# Patient Record
Sex: Male | Born: 1965 | Race: Black or African American | Hispanic: No | Marital: Single | Smoking: Never smoker
Health system: Southern US, Community
[De-identification: ages and names within clinical notes are randomized; demographics above are authoritative.]

## PROBLEM LIST (undated history)

## (undated) DIAGNOSIS — E785 Hyperlipidemia, unspecified: Secondary | ICD-10-CM

## (undated) DIAGNOSIS — E119 Type 2 diabetes mellitus without complications: Secondary | ICD-10-CM

## (undated) DIAGNOSIS — I10 Essential (primary) hypertension: Secondary | ICD-10-CM

## (undated) HISTORY — DX: Type 2 diabetes mellitus without complications: E11.9

## (undated) HISTORY — PX: BARIATRIC SURGERY: SHX1103

## (undated) HISTORY — DX: Essential (primary) hypertension: I10

## (undated) HISTORY — DX: Hyperlipidemia, unspecified: E78.5

---

## 1998-06-09 ENCOUNTER — Emergency Department (HOSPITAL_COMMUNITY): Admission: EM | Admit: 1998-06-09 | Discharge: 1998-06-09 | Payer: Self-pay | Admitting: Emergency Medicine

## 1999-01-21 ENCOUNTER — Emergency Department (HOSPITAL_COMMUNITY): Admission: EM | Admit: 1999-01-21 | Discharge: 1999-01-21 | Payer: Self-pay | Admitting: Emergency Medicine

## 1999-01-25 ENCOUNTER — Emergency Department (HOSPITAL_COMMUNITY): Admission: EM | Admit: 1999-01-25 | Discharge: 1999-01-25 | Payer: Self-pay | Admitting: Emergency Medicine

## 2003-09-11 ENCOUNTER — Emergency Department (HOSPITAL_COMMUNITY): Admission: AD | Admit: 2003-09-11 | Discharge: 2003-09-11 | Payer: Self-pay | Admitting: Family Medicine

## 2003-10-16 ENCOUNTER — Emergency Department (HOSPITAL_COMMUNITY): Admission: EM | Admit: 2003-10-16 | Discharge: 2003-10-17 | Payer: Self-pay | Admitting: Emergency Medicine

## 2006-09-22 ENCOUNTER — Emergency Department (HOSPITAL_COMMUNITY): Admission: EM | Admit: 2006-09-22 | Discharge: 2006-09-22 | Payer: Self-pay | Admitting: Emergency Medicine

## 2016-07-14 DIAGNOSIS — E1142 Type 2 diabetes mellitus with diabetic polyneuropathy: Secondary | ICD-10-CM | POA: Insufficient documentation

## 2016-11-22 DIAGNOSIS — F419 Anxiety disorder, unspecified: Secondary | ICD-10-CM | POA: Insufficient documentation

## 2017-02-18 DIAGNOSIS — B351 Tinea unguium: Secondary | ICD-10-CM | POA: Insufficient documentation

## 2017-02-18 DIAGNOSIS — B353 Tinea pedis: Secondary | ICD-10-CM | POA: Insufficient documentation

## 2017-06-03 DIAGNOSIS — G4733 Obstructive sleep apnea (adult) (pediatric): Secondary | ICD-10-CM | POA: Insufficient documentation

## 2018-04-08 DIAGNOSIS — Z9884 Bariatric surgery status: Secondary | ICD-10-CM | POA: Insufficient documentation

## 2018-05-23 DIAGNOSIS — E1169 Type 2 diabetes mellitus with other specified complication: Secondary | ICD-10-CM | POA: Insufficient documentation

## 2019-05-01 DIAGNOSIS — Z9884 Bariatric surgery status: Secondary | ICD-10-CM | POA: Insufficient documentation

## 2020-12-28 ENCOUNTER — Encounter: Payer: Self-pay | Admitting: Critical Care Medicine

## 2020-12-29 ENCOUNTER — Other Ambulatory Visit: Payer: Self-pay | Admitting: Critical Care Medicine

## 2020-12-29 ENCOUNTER — Telehealth: Payer: Self-pay | Admitting: Critical Care Medicine

## 2020-12-29 ENCOUNTER — Other Ambulatory Visit: Payer: Self-pay | Admitting: *Deleted

## 2020-12-29 MED ORDER — TOPIRAMATE 100 MG PO TABS: 100.0000 mg | ORAL_TABLET | Freq: Two times a day (BID) | ORAL | 2 refills | Status: DC

## 2020-12-29 MED ORDER — PREGABALIN 150 MG PO CAPS
150.0000 mg | ORAL_CAPSULE | Freq: Two times a day (BID) | ORAL | 2 refills | Status: DC
Start: 2020-12-29 — End: 2021-01-16

## 2020-12-29 MED ORDER — LISINOPRIL 30 MG PO TABS: 30.0000 mg | ORAL_TABLET | Freq: Every day | ORAL | 2 refills | Status: DC

## 2020-12-29 MED ORDER — TRIAMCINOLONE ACETONIDE 0.1 % EX CREA: 1.0000 "application " | TOPICAL_CREAM | Freq: Two times a day (BID) | CUTANEOUS | 0 refills | Status: DC

## 2020-12-29 MED ORDER — PREDNISONE 10 MG PO TABS: ORAL_TABLET | ORAL | 0 refills | Status: DC

## 2020-12-29 MED ORDER — AZITHROMYCIN 250 MG PO TABS: ORAL_TABLET | ORAL | 0 refills | Status: DC

## 2020-12-29 MED ORDER — NYSTATIN 100000 UNIT/GM EX POWD: 1.0000 "application " | Freq: Three times a day (TID) | CUTANEOUS | 2 refills | Status: AC

## 2020-12-29 MED ORDER — POLYETHYLENE GLYCOL 3350 17 G PO PACK: 17.0000 g | PACK | Freq: Every day | ORAL | 2 refills | Status: DC

## 2020-12-29 MED ORDER — PROAIR RESPICLICK 108 (90 BASE) MCG/ACT IN AEPB: 2.0000 | INHALATION_SPRAY | Freq: Four times a day (QID) | RESPIRATORY_TRACT | 4 refills | Status: DC | PRN

## 2020-12-29 NOTE — Telephone Encounter (Signed)
Pt called stating that he was seen yesterday 12/28/20 at the [facility]. He states that he was also missing the medications lisinopril and lyrica refilled. He states that he is needing to have nurse pick these up for him to take to him. Please advise.

## 2020-12-29 NOTE — Telephone Encounter (Signed)
Medications has been refilled
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
## 2021-01-04 ENCOUNTER — Encounter: Payer: Self-pay | Admitting: Critical Care Medicine

## 2021-01-04 ENCOUNTER — Other Ambulatory Visit: Payer: Self-pay | Admitting: Critical Care Medicine

## 2021-01-04 MED ORDER — FUROSEMIDE 40 MG PO TABS: 40.0000 mg | ORAL_TABLET | Freq: Every day | ORAL | 3 refills | Status: DC | PRN
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
## 2021-01-12 ENCOUNTER — Telehealth: Payer: Self-pay | Admitting: Critical Care Medicine

## 2021-01-12 NOTE — Telephone Encounter (Signed)
Will have shelter nurse f/u

## 2021-01-12 NOTE — Telephone Encounter (Signed)
Copied from CRM [id number]. Topic: General - Other >> Jan 11, 2021  6:48 PM Pawlus, Jeremy Bauer wrote: Reason for CRM: Pt stated he is at the [facility] and requested Bauer call from [name] as soon as possible. Please advise.
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
## 2021-01-16 ENCOUNTER — Telehealth: Payer: Self-pay

## 2021-01-16 ENCOUNTER — Other Ambulatory Visit: Payer: Self-pay

## 2021-01-16 ENCOUNTER — Ambulatory Visit: Payer: Medicaid Other | Attending: Critical Care Medicine | Admitting: Critical Care Medicine

## 2021-01-16 ENCOUNTER — Encounter: Payer: Self-pay | Admitting: Critical Care Medicine

## 2021-01-16 VITALS — BP 120/78 | HR 79 | Ht 73.0 in | Wt >= 6400 oz

## 2021-01-16 DIAGNOSIS — Z7984 Long term (current) use of oral hypoglycemic drugs: Secondary | ICD-10-CM | POA: Insufficient documentation

## 2021-01-16 DIAGNOSIS — E1142 Type 2 diabetes mellitus with diabetic polyneuropathy: Secondary | ICD-10-CM | POA: Insufficient documentation

## 2021-01-16 DIAGNOSIS — R21 Rash and other nonspecific skin eruption: Secondary | ICD-10-CM | POA: Insufficient documentation

## 2021-01-16 DIAGNOSIS — N400 Enlarged prostate without lower urinary tract symptoms: Secondary | ICD-10-CM | POA: Diagnosis not present

## 2021-01-16 DIAGNOSIS — S40861A Insect bite (nonvenomous) of right upper arm, initial encounter: Secondary | ICD-10-CM | POA: Diagnosis not present

## 2021-01-16 DIAGNOSIS — E1165 Type 2 diabetes mellitus with hyperglycemia: Secondary | ICD-10-CM

## 2021-01-16 DIAGNOSIS — E871 Hypo-osmolality and hyponatremia: Secondary | ICD-10-CM | POA: Insufficient documentation

## 2021-01-16 DIAGNOSIS — R269 Unspecified abnormalities of gait and mobility: Secondary | ICD-10-CM | POA: Insufficient documentation

## 2021-01-16 DIAGNOSIS — Z1159 Encounter for screening for other viral diseases: Secondary | ICD-10-CM

## 2021-01-16 DIAGNOSIS — Z79899 Other long term (current) drug therapy: Secondary | ICD-10-CM | POA: Insufficient documentation

## 2021-01-16 DIAGNOSIS — Z6841 Body Mass Index (BMI) 40.0 and over, adult: Secondary | ICD-10-CM | POA: Insufficient documentation

## 2021-01-16 DIAGNOSIS — Z87891 Personal history of nicotine dependence: Secondary | ICD-10-CM | POA: Insufficient documentation

## 2021-01-16 DIAGNOSIS — N529 Male erectile dysfunction, unspecified: Secondary | ICD-10-CM | POA: Diagnosis not present

## 2021-01-16 DIAGNOSIS — Z5901 Sheltered homelessness: Secondary | ICD-10-CM | POA: Diagnosis not present

## 2021-01-16 DIAGNOSIS — W57XXXA Bitten or stung by nonvenomous insect and other nonvenomous arthropods, initial encounter: Secondary | ICD-10-CM | POA: Diagnosis not present

## 2021-01-16 DIAGNOSIS — E785 Hyperlipidemia, unspecified: Secondary | ICD-10-CM | POA: Insufficient documentation

## 2021-01-16 DIAGNOSIS — Z114 Encounter for screening for human immunodeficiency virus [HIV]: Secondary | ICD-10-CM | POA: Diagnosis not present

## 2021-01-16 DIAGNOSIS — E1169 Type 2 diabetes mellitus with other specified complication: Secondary | ICD-10-CM | POA: Diagnosis not present

## 2021-01-16 DIAGNOSIS — E119 Type 2 diabetes mellitus without complications: Secondary | ICD-10-CM | POA: Insufficient documentation

## 2021-01-16 DIAGNOSIS — I10 Essential (primary) hypertension: Secondary | ICD-10-CM | POA: Diagnosis not present

## 2021-01-16 DIAGNOSIS — S40862A Insect bite (nonvenomous) of left upper arm, initial encounter: Secondary | ICD-10-CM | POA: Insufficient documentation

## 2021-01-16 DIAGNOSIS — N521 Erectile dysfunction due to diseases classified elsewhere: Secondary | ICD-10-CM

## 2021-01-16 DIAGNOSIS — Z9884 Bariatric surgery status: Secondary | ICD-10-CM | POA: Diagnosis not present

## 2021-01-16 DIAGNOSIS — N401 Enlarged prostate with lower urinary tract symptoms: Secondary | ICD-10-CM

## 2021-01-16 DIAGNOSIS — R3911 Hesitancy of micturition: Secondary | ICD-10-CM

## 2021-01-16 MED ORDER — TAMSULOSIN HCL 0.4 MG PO CAPS
0.4000 mg | ORAL_CAPSULE | Freq: Every day | ORAL | 3 refills | Status: DC
  Filled 2021-01-16: qty 30, 30d supply, fill #0
  Filled 2021-02-12: qty 30, 30d supply, fill #1
  Filled 2021-03-15 (×3): qty 30, 30d supply, fill #2

## 2021-01-16 MED ORDER — TRULICITY 0.75 MG/0.5ML ~~LOC~~ SOAJ
0.7500 mg | SUBCUTANEOUS | 4 refills | Status: DC
  Filled 2021-01-16: qty 2, 28d supply, fill #0

## 2021-01-16 MED ORDER — ACCU-CHEK GUIDE VI STRP
ORAL_STRIP | 12 refills | Status: DC
  Filled 2021-01-16: qty 100, 50d supply, fill #0

## 2021-01-16 MED ORDER — TAMSULOSIN HCL 0.4 MG PO CAPS: 0.4000 mg | ORAL_CAPSULE | Freq: Every day | ORAL | 3 refills | Status: DC

## 2021-01-16 MED ORDER — TRIAMCINOLONE ACETONIDE 0.1 % EX CREA: 1.0000 "application " | TOPICAL_CREAM | Freq: Two times a day (BID) | CUTANEOUS | 0 refills | Status: DC

## 2021-01-16 MED ORDER — PREGABALIN 225 MG PO CAPS
225.0000 mg | ORAL_CAPSULE | Freq: Two times a day (BID) | ORAL | 1 refills | Status: DC
  Filled 2021-01-16 (×2): qty 60, 30d supply, fill #0
  Filled 2021-02-28: qty 60, 30d supply, fill #1

## 2021-01-16 MED ORDER — ACCU-CHEK SOFTCLIX LANCETS MISC
12 refills | Status: DC
  Filled 2021-01-16: qty 100, 50d supply, fill #0

## 2021-01-16 MED ORDER — TRIAMCINOLONE ACETONIDE 0.1 % EX CREA
1.0000 "application " | TOPICAL_CREAM | Freq: Two times a day (BID) | CUTANEOUS | 0 refills | Status: DC
  Filled 2021-01-16: qty 30, 15d supply, fill #0

## 2021-01-16 MED ORDER — ACCU-CHEK GUIDE W/DEVICE KIT
PACK | 0 refills | Status: AC
  Filled 2021-01-16: qty 1, 30d supply, fill #0

## 2021-01-16 MED ORDER — MELOXICAM 15 MG PO TABS
15.0000 mg | ORAL_TABLET | Freq: Every day | ORAL | 0 refills | Status: DC
  Filled 2021-01-16: qty 30, 30d supply, fill #0

## 2021-01-16 NOTE — Assessment & Plan Note (Signed)
Plan referral to bariatric weight loss clinic

## 2021-01-16 NOTE — Progress Notes (Signed)
Pain in legs. Urination problem.

## 2021-01-16 NOTE — Assessment & Plan Note (Signed)
Benign prostatic hypertrophy we will plan to begin Flomax

## 2021-01-16 NOTE — Assessment & Plan Note (Signed)
Type 2 diabetes not well controlled on 1000 mg twice daily metformin  Begin Trulicity 0.75 mg weekly  Diabetic diet education given

## 2021-01-16 NOTE — Assessment & Plan Note (Addendum)
Severe diabetic neuropathy plan to increase Lyrica 225 mg twice daily

## 2021-01-16 NOTE — Patient Instructions (Addendum)
Labs today include metabolic panel hepatitis C and HIV  A new glucose meter will be issued with testing supplies  We will ask Medicaid to supply you with a Rollator someone will contact you to deliver this to the shelter  Will increase Lyrica to 225 mg twice daily  Begin Trulicity 1 injection weekly   No change in other medications other than steroid cream given for the bedbug rash  Also will begin Flomax daily to improve the stream of your urine  Meloxicam daily given for joint pain  Return to see Dr. Delford Field here in the clinic in 2 months and we will continue to check on you weekly at the shelter

## 2021-01-16 NOTE — Assessment & Plan Note (Signed)
Patient requesting medication for erectile dysfunction we will hold for now

## 2021-01-16 NOTE — Telephone Encounter (Signed)
Order for rollator faxed to [facility].  This CM informed patient that [facility] should be contacting him about delivery of the rollator.  If they are not able to deliver the DME, instructed him to contact this CM to arrange for pick up Jeremy Bauer

## 2021-01-17 ENCOUNTER — Other Ambulatory Visit: Payer: Self-pay

## 2021-01-17 LAB — COMPREHENSIVE METABOLIC PANEL
ALT: 78 IU/L — ABNORMAL HIGH (ref 0–44)
AST: 33 IU/L (ref 0–40)
Albumin/Globulin Ratio: 1.1 — ABNORMAL LOW (ref 1.2–2.2)
Albumin: 3.9 g/dL (ref 3.8–4.9)
Alkaline Phosphatase: 79 IU/L (ref 44–121)
BUN/Creatinine Ratio: 19 (ref 9–20)
BUN: 20 mg/dL (ref 6–24)
Bilirubin Total: 0.3 mg/dL (ref 0.0–1.2)
CO2: 22 mmol/L (ref 20–29)
Calcium: 9.6 mg/dL (ref 8.7–10.2)
Chloride: 101 mmol/L (ref 96–106)
Creatinine, Ser: 1.03 mg/dL (ref 0.76–1.27)
Globulin, Total: 3.5 g/dL (ref 1.5–4.5)
Glucose: 318 mg/dL — ABNORMAL HIGH (ref 65–99)
Potassium: 5 mmol/L (ref 3.5–5.2)
Sodium: 135 mmol/L (ref 134–144)
Total Protein: 7.4 g/dL (ref 6.0–8.5)
eGFR: 86 mL/min/{1.73_m2} (ref >59)

## 2021-01-17 LAB — HEMOGLOBIN A1C
Est. average glucose Bld gHb Est-mCnc: 206 mg/dL
Hgb A1c MFr Bld: 8.8 % — ABNORMAL HIGH (ref 4.8–5.6)

## 2021-01-17 LAB — HCV INTERPRETATION

## 2021-01-17 LAB — HCV AB W REFLEX TO QUANT PCR: HCV Ab: <0.1 s/co ratio (ref 0.0–0.9)

## 2021-01-17 LAB — HIV ANTIBODY (ROUTINE TESTING W REFLEX): HIV Screen 4th Generation wRfx: NONREACTIVE

## 2021-01-18 ENCOUNTER — Other Ambulatory Visit: Payer: Self-pay

## 2021-01-18 ENCOUNTER — Encounter: Payer: Self-pay | Admitting: Critical Care Medicine

## 2021-01-18 ENCOUNTER — Encounter: Payer: Self-pay | Admitting: *Deleted

## 2021-01-18 ENCOUNTER — Other Ambulatory Visit (HOSPITAL_COMMUNITY): Payer: Self-pay

## 2021-01-18 ENCOUNTER — Other Ambulatory Visit: Payer: Self-pay | Admitting: Critical Care Medicine

## 2021-01-18 DIAGNOSIS — L84 Corns and callosities: Secondary | ICD-10-CM

## 2021-01-18 DIAGNOSIS — E1165 Type 2 diabetes mellitus with hyperglycemia: Secondary | ICD-10-CM

## 2021-01-18 MED ORDER — TADALAFIL 10 MG PO TABS
10.0000 mg | ORAL_TABLET | ORAL | 1 refills | Status: DC | PRN
  Filled 2021-01-18: qty 10, 20d supply, fill #0
  Filled 2021-02-28: qty 10, 20d supply, fill #1

## 2021-01-18 NOTE — Congregational Nurse Program (Signed)
Discussed and demo cbg, instructed on trulicity inj.  Discussed h2o intake

## 2021-01-18 NOTE — Progress Notes (Signed)
Patient ID: Jeremy Bauer, male   DOB: 08/06/1965, 55 y.o.   MRN: [id number] Is a 55 year old male seen at the [facility] in follow-up.  We saw him earlier this week to establish at health and wellness.  I went over with the patient all of his medications.  On exam blood pressure 132/82 pulse is 85 saturation 99% room air blood sugar was 350 note he just started his first dose of Trulicity today also maintaining metformin trying to improve his diet  Patient would like an ophthalmology referral and also podiatry referral we will comply with this we will also give him a short course of Cialis to take as needed for erectile dysfunction nothing else was needed at this time

## 2021-01-20 ENCOUNTER — Inpatient Hospital Stay (HOSPITAL_COMMUNITY)
Admission: EM | Admit: 2021-01-20 | Discharge: 2021-01-22 | DRG: 683 | Disposition: A | Discharge status: Home / Self Care | Payer: Medicaid Other | Attending: Internal Medicine | Admitting: Internal Medicine

## 2021-01-20 ENCOUNTER — Emergency Department (HOSPITAL_COMMUNITY): Payer: Medicaid Other

## 2021-01-20 ENCOUNTER — Other Ambulatory Visit: Payer: Self-pay

## 2021-01-20 ENCOUNTER — Encounter (HOSPITAL_COMMUNITY): Payer: Self-pay | Admitting: Emergency Medicine

## 2021-01-20 DIAGNOSIS — Z79899 Other long term (current) drug therapy: Secondary | ICD-10-CM

## 2021-01-20 DIAGNOSIS — E1165 Type 2 diabetes mellitus with hyperglycemia: Secondary | ICD-10-CM | POA: Diagnosis present

## 2021-01-20 DIAGNOSIS — N179 Acute kidney failure, unspecified: Principal | ICD-10-CM | POA: Diagnosis present

## 2021-01-20 DIAGNOSIS — R0789 Other chest pain: Secondary | ICD-10-CM | POA: Diagnosis present

## 2021-01-20 DIAGNOSIS — F1010 Alcohol abuse, uncomplicated: Secondary | ICD-10-CM | POA: Diagnosis present

## 2021-01-20 DIAGNOSIS — Z8673 Personal history of transient ischemic attack (TIA), and cerebral infarction without residual deficits: Secondary | ICD-10-CM

## 2021-01-20 DIAGNOSIS — E1142 Type 2 diabetes mellitus with diabetic polyneuropathy: Secondary | ICD-10-CM | POA: Diagnosis present

## 2021-01-20 DIAGNOSIS — I951 Orthostatic hypotension: Secondary | ICD-10-CM | POA: Diagnosis present

## 2021-01-20 DIAGNOSIS — Z20822 Contact with and (suspected) exposure to covid-19: Secondary | ICD-10-CM | POA: Diagnosis present

## 2021-01-20 DIAGNOSIS — N400 Enlarged prostate without lower urinary tract symptoms: Secondary | ICD-10-CM | POA: Diagnosis present

## 2021-01-20 DIAGNOSIS — K8020 Calculus of gallbladder without cholecystitis without obstruction: Secondary | ICD-10-CM | POA: Diagnosis present

## 2021-01-20 DIAGNOSIS — Z9884 Bariatric surgery status: Secondary | ICD-10-CM

## 2021-01-20 DIAGNOSIS — E860 Dehydration: Secondary | ICD-10-CM | POA: Diagnosis present

## 2021-01-20 DIAGNOSIS — Z7984 Long term (current) use of oral hypoglycemic drugs: Secondary | ICD-10-CM

## 2021-01-20 DIAGNOSIS — G4733 Obstructive sleep apnea (adult) (pediatric): Secondary | ICD-10-CM | POA: Diagnosis present

## 2021-01-20 DIAGNOSIS — F1410 Cocaine abuse, uncomplicated: Secondary | ICD-10-CM | POA: Diagnosis present

## 2021-01-20 DIAGNOSIS — E785 Hyperlipidemia, unspecified: Secondary | ICD-10-CM | POA: Diagnosis present

## 2021-01-20 DIAGNOSIS — I959 Hypotension, unspecified: Secondary | ICD-10-CM

## 2021-01-20 DIAGNOSIS — L03115 Cellulitis of right lower limb: Secondary | ICD-10-CM | POA: Diagnosis present

## 2021-01-20 DIAGNOSIS — Z8249 Family history of ischemic heart disease and other diseases of the circulatory system: Secondary | ICD-10-CM

## 2021-01-20 DIAGNOSIS — E861 Hypovolemia: Secondary | ICD-10-CM | POA: Diagnosis present

## 2021-01-20 DIAGNOSIS — Z833 Family history of diabetes mellitus: Secondary | ICD-10-CM

## 2021-01-20 DIAGNOSIS — N529 Male erectile dysfunction, unspecified: Secondary | ICD-10-CM | POA: Diagnosis present

## 2021-01-20 DIAGNOSIS — R55 Syncope and collapse: Secondary | ICD-10-CM | POA: Diagnosis not present

## 2021-01-20 LAB — COMPREHENSIVE METABOLIC PANEL
ALT: 70 U/L — ABNORMAL HIGH (ref 0–44)
AST: 44 U/L — ABNORMAL HIGH (ref 15–41)
Albumin: 2.9 g/dL — ABNORMAL LOW (ref 3.5–5.0)
Alkaline Phosphatase: 49 U/L (ref 38–126)
Anion gap: 4 — ABNORMAL LOW (ref 5–15)
BUN: 35 mg/dL — ABNORMAL HIGH (ref 6–20)
CO2: 22 mmol/L (ref 22–32)
Calcium: 8.7 mg/dL — ABNORMAL LOW (ref 8.9–10.3)
Chloride: 107 mmol/L (ref 98–111)
Creatinine, Ser: 1.84 mg/dL — ABNORMAL HIGH (ref 0.61–1.24)
GFR, Estimated: 43 mL/min — ABNORMAL LOW (ref >60)
Glucose, Bld: 264 mg/dL — ABNORMAL HIGH (ref 70–99)
Potassium: 4.8 mmol/L (ref 3.5–5.1)
Sodium: 133 mmol/L — ABNORMAL LOW (ref 135–145)
Total Bilirubin: 0.7 mg/dL (ref 0.3–1.2)
Total Protein: 6.1 g/dL — ABNORMAL LOW (ref 6.5–8.1)

## 2021-01-20 LAB — RESP PANEL BY RT-PCR (FLU A&B, COVID) ARPGX2
Influenza A by PCR: NEGATIVE
Influenza B by PCR: NEGATIVE
SARS Coronavirus 2 by RT PCR: NEGATIVE

## 2021-01-20 LAB — LACTIC ACID, PLASMA
Lactic Acid, Venous: 2.7 mmol/L (ref 0.5–1.9)
Lactic Acid, Venous: 2.1 mmol/L (ref 0.5–1.9)

## 2021-01-20 LAB — TROPONIN I (HIGH SENSITIVITY): Troponin I (High Sensitivity): 5 ng/L (ref <18)

## 2021-01-20 LAB — CBC
HCT: 37.8 % — ABNORMAL LOW (ref 39.0–52.0)
Hemoglobin: 12.6 g/dL — ABNORMAL LOW (ref 13.0–17.0)
MCH: 34.1 pg — ABNORMAL HIGH (ref 26.0–34.0)
MCHC: 33.3 g/dL (ref 30.0–36.0)
MCV: 102.4 fL — ABNORMAL HIGH (ref 80.0–100.0)
Platelets: 255 10*3/uL (ref 150–400)
RBC: 3.69 MIL/uL — ABNORMAL LOW (ref 4.22–5.81)
RDW: 11.4 % — ABNORMAL LOW (ref 11.5–15.5)
WBC: 6.9 10*3/uL (ref 4.0–10.5)
nRBC: 0 % (ref 0.0–0.2)

## 2021-01-20 LAB — URINALYSIS, ROUTINE W REFLEX MICROSCOPIC
Bilirubin Urine: NEGATIVE
Glucose, UA: NEGATIVE mg/dL
Hgb urine dipstick: NEGATIVE
Ketones, ur: NEGATIVE mg/dL
Leukocytes,Ua: NEGATIVE
Nitrite: NEGATIVE
Protein, ur: NEGATIVE mg/dL
Specific Gravity, Urine: 1.019 (ref 1.005–1.030)
pH: 5 (ref 5.0–8.0)

## 2021-01-20 LAB — CBG MONITORING, ED
Glucose-Capillary: 128 mg/dL — ABNORMAL HIGH (ref 70–99)
Glucose-Capillary: 296 mg/dL — ABNORMAL HIGH (ref 70–99)
Glucose-Capillary: 334 mg/dL — ABNORMAL HIGH (ref 70–99)

## 2021-01-20 LAB — GLUCOSE, CAPILLARY: Glucose-Capillary: 277 mg/dL — ABNORMAL HIGH (ref 70–99)

## 2021-01-20 MED ORDER — ALBUTEROL SULFATE 108 (90 BASE) MCG/ACT IN AEPB: 2.0000 | INHALATION_SPRAY | Freq: Four times a day (QID) | RESPIRATORY_TRACT | Status: DC | PRN

## 2021-01-20 MED ORDER — TAMSULOSIN HCL 0.4 MG PO CAPS: 0.4000 mg | ORAL_CAPSULE | Freq: Every day | ORAL | Status: DC

## 2021-01-20 MED ORDER — ADULT MULTIVITAMIN W/MINERALS CH
1.0000 | ORAL_TABLET | Freq: Every day | ORAL | Status: DC
  Administered 2021-01-21 – 2021-01-22 (×2): 1 via ORAL
  Filled 2021-01-20 (×2): qty 1

## 2021-01-20 MED ORDER — POLYETHYLENE GLYCOL 3350 17 G PO PACK
17.0000 g | PACK | Freq: Every day | ORAL | Status: DC | PRN
  Administered 2021-01-20: 17 g via ORAL
  Filled 2021-01-20: qty 1

## 2021-01-20 MED ORDER — ACETAMINOPHEN 325 MG PO TABS: 650.0000 mg | ORAL_TABLET | Freq: Four times a day (QID) | ORAL | Status: DC | PRN

## 2021-01-20 MED ORDER — TRIAMCINOLONE ACETONIDE 0.1 % EX CREA
1.0000 "application " | TOPICAL_CREAM | Freq: Two times a day (BID) | CUTANEOUS | Status: DC
  Administered 2021-01-20 – 2021-01-22 (×4): 1 via TOPICAL
  Filled 2021-01-20: qty 15

## 2021-01-20 MED ORDER — INSULIN ASPART 100 UNIT/ML IJ SOLN
0.0000 [IU] | Freq: Three times a day (TID) | INTRAMUSCULAR | Status: DC
  Administered 2021-01-21 (×2): 4 [IU] via SUBCUTANEOUS
  Administered 2021-01-21: 11 [IU] via SUBCUTANEOUS
  Administered 2021-01-22: 4 [IU] via SUBCUTANEOUS

## 2021-01-20 MED ORDER — NYSTATIN 100000 UNIT/GM EX POWD
1.0000 "application " | Freq: Three times a day (TID) | CUTANEOUS | Status: DC
  Administered 2021-01-20 – 2021-01-22 (×5): 1 via TOPICAL
  Filled 2021-01-20: qty 15

## 2021-01-20 MED ORDER — CEPHALEXIN 500 MG PO CAPS
500.0000 mg | ORAL_CAPSULE | Freq: Three times a day (TID) | ORAL | Status: DC
  Administered 2021-01-20 – 2021-01-22 (×5): 500 mg via ORAL
  Filled 2021-01-20 (×5): qty 1

## 2021-01-20 MED ORDER — POLYETHYLENE GLYCOL 3350 17 G PO PACK
17.0000 g | PACK | Freq: Every day | ORAL | Status: DC
  Administered 2021-01-21 – 2021-01-22 (×2): 17 g via ORAL
  Filled 2021-01-20 (×2): qty 1

## 2021-01-20 MED ORDER — SIMVASTATIN 20 MG PO TABS
20.0000 mg | ORAL_TABLET | Freq: Every day | ORAL | Status: DC
  Administered 2021-01-21 – 2021-01-22 (×2): 20 mg via ORAL
  Filled 2021-01-20 (×2): qty 1

## 2021-01-20 MED ORDER — SODIUM CHLORIDE 0.9 % IV SOLN: INTRAVENOUS | Status: AC

## 2021-01-20 MED ORDER — MELATONIN 3 MG PO TABS: 3.0000 mg | ORAL_TABLET | Freq: Every evening | ORAL | Status: DC | PRN

## 2021-01-20 MED ORDER — MULTIVITAMIN ADULT PO TABS: ORAL_TABLET | Freq: Every day | ORAL | Status: DC

## 2021-01-20 MED ORDER — SODIUM CHLORIDE 0.9 % IV BOLUS
1000.0000 mL | Freq: Once | INTRAVENOUS | Status: AC
  Administered 2021-01-20: 1000 mL via INTRAVENOUS

## 2021-01-20 MED ORDER — URSODIOL 250 MG PO TABS: 250.0000 mg | ORAL_TABLET | Freq: Two times a day (BID) | ORAL | Status: DC

## 2021-01-20 MED ORDER — INSULIN ASPART 100 UNIT/ML IJ SOLN
0.0000 [IU] | Freq: Every day | INTRAMUSCULAR | Status: DC
  Administered 2021-01-20: 4 [IU] via SUBCUTANEOUS

## 2021-01-20 MED ORDER — CALCIUM-VITAMIN D 600-400 MG-UNIT PO TABS: 1.0000 | ORAL_TABLET | Freq: Two times a day (BID) | ORAL | Status: DC

## 2021-01-20 MED ORDER — PREGABALIN 75 MG PO CAPS
225.0000 mg | ORAL_CAPSULE | Freq: Two times a day (BID) | ORAL | Status: DC
Start: 2021-01-20 — End: 2021-01-23
  Administered 2021-01-20 – 2021-01-22 (×4): 225 mg via ORAL
  Filled 2021-01-20 (×4): qty 3

## 2021-01-20 MED ORDER — ALBUTEROL SULFATE HFA 108 (90 BASE) MCG/ACT IN AERS
2.0000 | INHALATION_SPRAY | Freq: Four times a day (QID) | RESPIRATORY_TRACT | Status: DC | PRN
  Filled 2021-01-20: qty 6.7

## 2021-01-20 MED ORDER — CALCIUM CARBONATE-VITAMIN D 500-200 MG-UNIT PO TABS
1.0000 | ORAL_TABLET | Freq: Two times a day (BID) | ORAL | Status: DC
  Administered 2021-01-20 – 2021-01-22 (×4): 1 via ORAL
  Filled 2021-01-20 (×4): qty 1

## 2021-01-20 MED ORDER — ENOXAPARIN SODIUM 40 MG/0.4ML IJ SOSY
40.0000 mg | PREFILLED_SYRINGE | INTRAMUSCULAR | Status: DC
  Administered 2021-01-20 (×2): 40 mg via SUBCUTANEOUS
  Filled 2021-01-20: qty 0.4

## 2021-01-20 MED ORDER — TOPIRAMATE 25 MG PO TABS
100.0000 mg | ORAL_TABLET | Freq: Two times a day (BID) | ORAL | Status: DC
  Administered 2021-01-20 – 2021-01-22 (×4): 100 mg via ORAL
  Filled 2021-01-20 (×4): qty 4

## 2021-01-20 MED ORDER — INSULIN GLARGINE 100 UNIT/ML ~~LOC~~ SOLN
7.0000 [IU] | Freq: Every day | SUBCUTANEOUS | Status: DC
  Administered 2021-01-20 – 2021-01-21 (×2): 7 [IU] via SUBCUTANEOUS
  Filled 2021-01-20 (×3): qty 0.07

## 2021-01-20 MED ORDER — URSODIOL 60 MG/ML SUSP
250.0000 mg | Freq: Two times a day (BID) | ORAL | Status: DC
  Filled 2021-01-20: qty 4.2

## 2021-01-20 MED ORDER — TRAZODONE HCL 150 MG PO TABS
150.0000 mg | ORAL_TABLET | Freq: Every day | ORAL | Status: DC
  Administered 2021-01-20 – 2021-01-21 (×2): 150 mg via ORAL
  Filled 2021-01-20 (×2): qty 1

## 2021-01-20 NOTE — ED Triage Notes (Signed)
Pt BIB [facility] for blurred vision/hypotension. Manual BP with EMS was 60/40, automated was 80/50. Given 500 mL NS. Pt takes lasix, metformin. Pt endorses small amounts of diarrhea. Evaluated last week for heat exhaustion. Able to ambulate without difficulty.

## 2021-01-20 NOTE — ED Provider Notes (Signed)
[facility] EMERGENCY DEPARTMENT Provider Note   CSN: [id number] Arrival date & time: 01/20/21  1317     History Chief Complaint  Patient presents with   Hypotension    Jeremy Bauer is a 55 y.o. male.  HPI  Patient presents with lightheadedness, dizziness, blurry vision.  Started today when he was sitting down during lunch.  Has not tried any alleviating factors.  Patient states he is eating and drinking normally.  No changes in medication recently.  States that a week ago he was having chest pain that radiated to the left arm.  This is happening again today.  States he was worked up in the ER 1 week ago and released.  Patient is diabetic, states his sugars have been in the 300s lately.  He is not having any associated vomiting, but he is nauseated.  States he has been feeling increasingly fatigued, states he is getting tired after walking across the room.  This is a new development for him.  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  Social History   Tobacco Use   Smoking status: Never   Smokeless tobacco: Never    Home Medications Prior to Admission  medications   Medication Sig Start Date End Date Taking? Authorizing Provider  Accu-Chek Softclix Lancets lancets Use as directed 01/16/21   Elsie Stain, MD  Albuterol Sulfate (PROAIR RESPICLICK) 765 (90 Base) MCG/ACT AEPB Inhale 2 puffs into the lungs every 6 (six) hours as needed. 12/29/20   Elsie Stain, MD  Blood Glucose Monitoring Suppl (ACCU-CHEK GUIDE) w/Device KIT use as directed 01/16/21   Elsie Stain, MD  Calcium Carb-Cholecalciferol (CALCIUM-VITAMIN D) 600-400 MG-UNIT TABS Take 1 tablet by mouth 2 (two) times daily.    [provider]  Dulaglutide (TRULICITY) 4.65 KP/5.4SF SOPN Inject 0.75 mg into the skin once a week. 01/16/21   Elsie Stain, MD  furosemide (LASIX) 40 MG tablet Take 1 tablet (40 mg total) by mouth daily as needed. 01/04/21   Elsie Stain, MD  glucose blood (ACCU-CHEK GUIDE) test strip use as directed 01/16/21   Elsie Stain, MD  lisinopril (ZESTRIL) 30 MG tablet Take 1 tablet (30 mg total) by mouth daily. 12/29/20   Elsie Stain, MD  meloxicam (MOBIC) 15 MG tablet Take 1 tablet (15 mg total) by mouth daily. 01/16/21   Elsie Stain, MD  metFORMIN (GLUCOPHAGE) 500 MG tablet Take 1,000 mg by mouth 2 (two) times daily with a meal.    [provider]  nystatin powder Apply 1 application topically 3 (three) times daily. 12/29/20   Elsie Stain, MD  polyethylene glycol (MIRALAX / GLYCOLAX) 17 g packet Take 17 g by mouth daily. 12/29/20   Elsie Stain, MD  pregabalin (LYRICA) 225 MG capsule Take 1 capsule (225 mg total) by mouth 2 (two) times daily. 01/16/21   Elsie Stain, MD  simvastatin (ZOCOR) 20 MG tablet Take 20 mg by mouth daily.    [provider]  tadalafil (CIALIS) 10 MG tablet Take 1 tablet (10 mg total) by mouth every other day as needed for erectile dysfunction. 01/18/21   Elsie Stain, MD  tamsulosin (FLOMAX) 0.4 MG CAPS capsule Take 1 capsule (0.4 mg total) by mouth daily. 01/16/21    Elsie Stain, MD  topiramate (TOPAMAX) 100 MG tablet Take 1 tablet (100 mg total) by mouth 2 (two) times daily. 12/29/20   Elsie Stain, MD  traZODone (DESYREL) 150 MG tablet Take by mouth at bedtime.    [provider]  triamcinolone cream (KENALOG) 0.1 % Apply 1 application topically 2 (two) times daily. 01/16/21   Elsie Stain, MD  ursodiol (ACTIGALL) 250 MG tablet Take 250 mg by mouth 3 (three) times daily.    [provider]    Allergies    Patient has no known allergies.  Review of Systems   Review of Systems  Constitutional:  Positive for fatigue. Negative for fever.  Respiratory:  Negative for shortness of breath.   Cardiovascular:  Positive for chest pain. Negative for leg swelling.  Gastrointestinal:  Positive for nausea and vomiting. Negative for abdominal pain.  Genitourinary:  Negative for dysuria, hematuria and urgency.  Neurological:  Positive for dizziness, weakness and light-headedness. Negative for numbness.   Physical Exam Updated Vital Signs BP (!) 147/95 (BP Location: Right Arm)   Pulse 86   Temp 97.6 F (36.4 C) (Oral)   Resp 20   Ht 6' 1"  (1.854 m)   Wt (!) 192.8 kg   SpO2 100%   BMI 56.07 kg/m   Physical Exam Vitals and nursing note reviewed. Exam conducted with a chaperone present.  Constitutional:      Appearance: Normal appearance. He is obese.  HENT:     Head: Normocephalic and atraumatic.  Eyes:     General: No scleral icterus.       Right eye: No discharge.        Left eye: No discharge.     Extraocular Movements: Extraocular movements intact.     Pupils: Pupils are equal, round, and reactive to light.  Cardiovascular:     Rate and Rhythm: Normal rate and regular rhythm.     Pulses: Normal pulses.     Heart sounds: Normal heart sounds. No murmur heard.   No friction rub. No gallop.     Comments: Pulses are equal and symmetric on both sides.  S1-S2 without murmurs gallops or rubs.  Patient blood pressure is  76/40 in the room. Pulmonary:     Effort: Pulmonary effort is normal. No respiratory distress.     Breath sounds: Normal breath sounds.  Abdominal:     General: Abdomen is flat. Bowel sounds are normal. There is no distension.     Palpations: Abdomen is soft.     Tenderness: There is no abdominal tenderness.  Skin:    General: Skin is warm and dry.     Coloration: Skin is not jaundiced.  Neurological:     Mental Status: He is alert. Mental status is at baseline.     Coordination: Coordination normal.  Comments: Patient later states he felt grossly intact.  Strength is equal bilaterally.    ED Results / Procedures / Treatments   Labs (all labs ordered are listed, but only abnormal results are displayed) Labs Reviewed  CBC  COMPREHENSIVE METABOLIC PANEL  LACTIC ACID, PLASMA  LACTIC ACID, PLASMA  CBG MONITORING, ED  TROPONIN I (HIGH SENSITIVITY)    EKG None  Radiology No results found.  Procedures Procedures   Medications Ordered in ED Medications  sodium chloride 0.9 % bolus 1,000 mL (has no administration in time range)    ED Course  I have reviewed the triage vital signs and the nursing notes.  Pertinent labs & imaging results that were available during my care of the patient were reviewed by me and considered in my medical decision making (see chart for details).  Clinical Course as of 01/20/21 1656  Fri Jan 20, 2021  1533 DG Chest 2 View Cardiomegaly with vascular congestion  [HS]  1533 Glucose-Capillary(!): 128 Not hypoglycemic  [HS]  1533 CBC(!) No leukocytosis, mild anemia  [HS]  1609 CT Head Wo Contrast Palpated posterior occiput. No pain or tenderness to palpation. I do not appreciate a mobile mass. No firmness.  [HS]    Clinical Course User Index [HS] Sherrill Raring, PA-C   MDM Rules/Calculators/A&P                          Patient is hypertensive on arrival.  He is complaining of vision changes, lightheadedness and chest pain.  PE is on the  differential, although the patient did just have a chest pain work-up 1 week ago and did not have signs of ACS.  He is also complaining about being easily fatigable which would be concerning for ACS.  Patient is diabetic, although he does not appear hyperglycemic or hypoglycemic based on his CBG.  CT head did not show any acute signs of abnormality other than a hematoma to the occiput.  Physical exam is relatively unremarkable.  He has no focal deficits, heart and lung sounds are somewhat difficult to auscultate given body habitus but I do not hear any murmurs or adventitious lung sounds.  Patient has a lactic acidosis, he is also continuing to be hypotensive despite multiple liters of fluids.  He does improve some improvement of his lightheadedness while here in the ED.  I expect patient will need to be admitted for further evaluation given his AKI, hypertension, continued chest pain.  I am signing out care to Dr. Darl Householder who will follow the patient workup and admission.   Final Clinical Impression(s) / ED Diagnoses Final diagnoses:  None    Rx / DC Orders ED Discharge Orders     None        Sherrill Raring, PA-C 01/20/21 [place], DO 01/21/21 1512

## 2021-01-20 NOTE — ED Provider Notes (Signed)
  Physical Exam  BP 111/70   Pulse 72   Temp 97.6 F (36.4 C) (Oral)   Resp (!) 28   Ht 6\' 1"  (1.854 m)   Wt (!) 192.8 kg   SpO2 100%   BMI 56.07 kg/m   Physical Exam  ED Course/Procedures   Clinical Course as of 01/20/21 1950  Fri Jan 20, 2021  1533 DG Chest 2 View Cardiomegaly with vascular congestion  [HS]  1533 Glucose-Capillary(!): 128 Not hypoglycemic  [HS]  1533 CBC(!) No leukocytosis, mild anemia  [HS]  1609 CT Head Wo Contrast Palpated posterior occiput. No pain or tenderness to palpation. I do not appreciate a mobile mass. No firmness.  [HS]    Clinical Course User Index [HS] Jan 22, 2021, PA-C    Procedures  MDM  Care assumed at 5 PM.  Patient is here with dizziness.  Patient was noted to be hypotensive in the ED.  Blood pressure was in the 80s.  CT head was unremarkable.  Patient also has some chest pain as well. Signout pending labs.   7:51 PM Patient's initial lactate was 3.7 went down to 2.1.  Patient does have AKI with a creatinine of 1.8 and a baseline of 1.  Since there is no obvious source of lactic acidosis and hypotension was found, CT chest abdomen pelvis was ordered and showed no obvious mass or retroperitoneal hematoma.  After 2 L normal saline bolus, blood pressure came up to 111/70.  At this point will admit for hypotension of unclear etiology and AKI and near syncope.      Theron Arista, MD 01/20/21 [phone]

## 2021-01-20 NOTE — ED Notes (Signed)
Attempted report 

## 2021-01-20 NOTE — H&P (Addendum)
History and Physical  Jeremy Bauer OMV:[id number] DOB: 03-18-1966 DOA: 01/20/2021  Referring physician: Dr. Darl Householder, [place]. PCP: Elsie Stain, MD  Outpatient Specialists: Pulmonary. Patient coming from: Home.  Chief Complaint: Lightheadedness, fatigue dizziness, intermittent chest pain  HPI: Jeremy Bauer is a 55 y.o. male with medical history significant for severe morbid obesity status post laparoscopic sleeve gastrectomy, type 2 diabetes, diabetic polyneuropathy, BPH, erectile dysfunction associated with type 2 diabetes, polysubstance abuse including alcohol and cocaine, hypertension, OSA, who presented to [facility] ED via EMS with complaints of lightheadedness, fatigue, dizziness, and intermittent chest pain.  Manual BP with EMS was 60/40, automated was 80/50.  He was given 500 cc normal saline bolus.  Associated with small amounts of diarrhea, nausea without vomiting.  No recent changes in medications.  Also reports intermittent nonexertional left-sided chest pain that radiate to the left arm, dull, lasting about 15 minutes and resolving spontaneously, ongoing for weeks.  He had a chest pain work-up and was released from the ED at [facility] on 01/11/2021.  This afternoon around noon he felt dizzy had some visual spots which resolved spontaneously and recurred after 15 minutes.  He alerted the Chief of Staff and EMS was activated.  Presented to the ED for further evaluation.  Work-up in the ED revealed elevated lactic acid 2.7 improved with IV fluid to 2.1.  AKI with creatinine of 1.8 with baseline of 1.03, hyperglycemia, elevated AST and ALT.  EDP requested admission for AKI and presyncope.  ED Course:  Temperature 97.6.  BP 139/87, pulse 93, respiratory rate 18, O2 saturation 98% on room air.  Lab studies remarkable for serum sodium 133, potassium 4.8, serum bicarb 22, glucose 264, BUN 35, creatinine 1.84, GFR 43, anion gap 4, AST 44, ALT 70.  Lactic acid 2.1 from 2.7.  CBC is essentially unremarkable.   Hemoglobin A1c 8.8 on 01/16/2021.  Review of Systems: Review of systems as noted in the HPI. All other systems reviewed and are negative.   Past Medical History:  Diagnosis Date   Diabetes mellitus without complication ([place])    Hyperlipidemia    Hypertension    Past Surgical History:  Procedure Laterality Date   BARIATRIC SURGERY      Social History:  reports that he has never smoked. He has never used smokeless tobacco. No history on file for alcohol use and drug use.   No Known Allergies  Family history: Mother with history of hypertension and atrial fibrillation. Father with history of hypertension and diabetes  Prior to Admission medications   Medication Sig Start Date End Date Taking? Authorizing Provider  Accu-Chek Softclix Lancets lancets Use as directed 01/16/21  Yes Elsie Stain, MD  Albuterol Sulfate (PROAIR RESPICLICK) 628 (90 Base) MCG/ACT AEPB Inhale 2 puffs into the lungs every 6 (six) hours as needed. 12/29/20  Yes Elsie Stain, MD  Blood Glucose Monitoring Suppl (ACCU-CHEK GUIDE) w/Device KIT use as directed 01/16/21  Yes Elsie Stain, MD  Calcium Carb-Cholecalciferol (CALCIUM-VITAMIN D) 600-400 MG-UNIT TABS Take 1 tablet by mouth 2 (two) times daily.   Yes [provider]  diclofenac Sodium (VOLTAREN) 1 % GEL Apply 2 g topically 4 (four) times daily.   Yes [provider]  Dulaglutide (TRULICITY) 3.66 QH/4.7ML SOPN Inject 0.75 mg into the skin once a week. Patient taking differently: Inject 0.75 mg into the skin once a week. Thursdays 01/16/21  Yes Elsie Stain, MD  furosemide (LASIX) 40 MG tablet Take 1 tablet (40 mg total)  by mouth daily as needed. Patient taking differently: Take 40 mg by mouth daily. 01/04/21  Yes Elsie Stain, MD  glucose blood (ACCU-CHEK GUIDE) test strip use as directed 01/16/21  Yes Elsie Stain, MD  lisinopril (ZESTRIL) 30 MG tablet Take 1 tablet (30 mg total) by mouth daily. 12/29/20  Yes Elsie Stain, MD  meloxicam (MOBIC) 15 MG tablet Take 1 tablet (15 mg total) by mouth daily. 01/16/21  Yes Elsie Stain, MD  metFORMIN (GLUCOPHAGE) 1000 MG tablet Take 1,000 mg by mouth 2 (two) times daily with a meal.   Yes [provider]  Multiple Vitamin (MULTIVITAMIN ADULT PO) Take 1 tablet by mouth daily.   Yes [provider]  nystatin powder Apply 1 application topically 3 (three) times daily. 12/29/20  Yes Elsie Stain, MD  polyethylene glycol (MIRALAX / GLYCOLAX) 17 g packet Take 17 g by mouth daily. 12/29/20  Yes Elsie Stain, MD  pregabalin (LYRICA) 225 MG capsule Take 1 capsule (225 mg total) by mouth 2 (two) times daily. 01/16/21  Yes Elsie Stain, MD  simvastatin (ZOCOR) 20 MG tablet Take 20 mg by mouth daily.   Yes [provider]  tadalafil (CIALIS) 10 MG tablet Take 1 tablet (10 mg total) by mouth every other day as needed for erectile dysfunction. 01/18/21  Yes Elsie Stain, MD  tamsulosin (FLOMAX) 0.4 MG CAPS capsule Take 1 capsule (0.4 mg total) by mouth daily. 01/16/21  Yes Elsie Stain, MD  topiramate (TOPAMAX) 100 MG tablet Take 1 tablet (100 mg total) by mouth 2 (two) times daily. 12/29/20  Yes Elsie Stain, MD  traZODone (DESYREL) 150 MG tablet Take 150 mg by mouth at bedtime.   Yes [provider]  triamcinolone cream (KENALOG) 0.1 % Apply 1 application topically 2 (two) times daily. 01/16/21  Yes Elsie Stain, MD  ursodiol (ACTIGALL) 250 MG tablet Take 250 mg by mouth 2 (two) times daily.   Yes [provider]    Physical Exam: BP 139/87 (BP Location: Right Wrist)   Pulse 93   Temp 97.6 F (36.4 C) (Oral)   Resp 18   Ht _0  (1.854 m)   Wt (!) 192.8 kg   SpO2 98%   BMI 56.07 kg/m   General: 55 y.o. year-old male super morbidly obese in no acute distress.  Alert and oriented x3. Cardiovascular: Regular rate and rhythm with no rubs or gallops.  No thyromegaly or JVD noted.  Trace lower  extremity edema bilaterally. Respiratory: Clear to auscultation with no wheezes or rales. Good inspiratory effort. Abdomen: Soft nontender nondistended with normal bowel sounds x4 quadrants. Muskuloskeletal: No cyanosis or clubbing.  Trace edema in lower extremities bilaterally. Neuro: CN II-XII intact, strength, sensation, reflexes Skin: No ulcerative lesions noted or rashes.  Mild erythema, warmth noted in right lower extremity. Psychiatry: Judgement and insight appear normal. Mood is appropriate for condition and setting          Labs on Admission:  Basic Metabolic Panel: Recent Labs  Lab 01/16/21 1447 01/20/21 1614  NA 135 133*  K 5.0 4.8  CL 101 107  CO2 22 22  GLUCOSE 318* 264*  BUN 20 35*  CREATININE 1.03 1.84*  CALCIUM 9.6 8.7*   Liver Function Tests: Recent Labs  Lab 01/16/21 1447 01/20/21 1614  AST 33 44*  ALT 78* 70*  ALKPHOS 79 49  BILITOT 0.3 0.7  PROT 7.4 6.1*  ALBUMIN 3.9 2.9*  No results for input(s): LIPASE, AMYLASE in the last 168 hours. No results for input(s): AMMONIA in the last 168 hours. CBC: Recent Labs  Lab 01/20/21 1336  WBC 6.9  HGB 12.6*  HCT 37.8*  MCV 102.4*  PLT 255   Cardiac Enzymes: No results for input(s): CKTOTAL, CKMB, CKMBINDEX, TROPONINI in the last 168 hours.  BNP (last 3 results) No results for input(s): BNP in the last 8760 hours.  ProBNP (last 3 results) No results for input(s): PROBNP in the last 8760 hours.  CBG: Recent Labs  Lab 01/20/21 1435  GLUCAP 128*    Radiological Exams on Admission: DG Chest 2 View  Result Date: 01/20/2021 CLINICAL DATA:  Chest pain. EXAM: CHEST - 2 VIEW COMPARISON:  None. FINDINGS: Low lung volumes. The cardio pericardial silhouette is enlarged. There is pulmonary vascular congestion without overt pulmonary edema. No focal airspace consolidation or pleural effusion. Telemetry leads overlie the chest. IMPRESSION: Low volume film with cardiomegaly and vascular congestion.  Electronically Signed   By: Misty Stanley M.D.   On: 01/20/2021 14:33   CT Head Wo Contrast  Result Date: 01/20/2021 CLINICAL DATA:  Dizziness, hypertension EXAM: CT HEAD WITHOUT CONTRAST TECHNIQUE: Contiguous axial images were obtained from the base of the skull through the vertex without intravenous contrast. COMPARISON:  None. FINDINGS: Brain: No evidence of acute infarction, hemorrhage, hydrocephalus, extra-axial collection or mass lesion/mass effect. Vascular: Atherosclerotic calcifications involving the large vessels of the skull base. No unexpected hyperdense vessel. Skull: Normal. Negative for fracture or focal lesion. Sinuses/Orbits: No acute finding. Other: Ill-defined soft tissue density within the suboccipital soft tissues seen only on axial images. This area measures approximately 2.7 x 2.2 cm (series 3, image 5) IMPRESSION: 1. No acute intracranial findings. 2. Ill-defined soft tissue density within the midline suboccipital soft tissues measuring approximately 2.7 x 2.2 cm. Findings are nonspecific and could represent a hematoma, scarring, or potentially a soft tissue mass. Correlate with physical exam. Any prior outside cross-sectional imaging of the head or neck would be helpful to assess the stability of this finding. Electronically Signed   By: Davina Poke D.O.   On: 01/20/2021 15:51   CT Chest Wo Contrast  Result Date: 01/20/2021 CLINICAL DATA:  Flank pain, suspected kidney stone in a patient with hypotension and chest pain. Also with acute kidney injury in this 54 year old male. EXAM: CT CHEST, ABDOMEN AND PELVIS WITHOUT CONTRAST TECHNIQUE: Multidetector CT imaging of the chest, abdomen and pelvis was performed following the standard protocol without IV contrast. COMPARISON:  None aside from chest x-ray from January 20, 2021. FINDINGS: CT CHEST FINDINGS Cardiovascular: Normal caliber thoracic aorta with scattered atherosclerosis. Three-vessel coronary artery disease. Normal heart size  without pericardial effusion. Normal appearance of central pulmonary vasculature in terms of caliber. Limited assessment of cardiovascular structures given lack of intravenous contrast. Mediastinum/Nodes: Esophagus mildly patulous. No mediastinal, thoracic inlet or axillary lymphadenopathy. No gross hilar lymphadenopathy. Lungs/Pleura: No signs of consolidation or evidence of pleural effusion. Airways are patent. Musculoskeletal: Spinal degenerative changes. No acute or destructive bone process. Visualized clavicles and scapulae as well sternum are unremarkable. CT ABDOMEN PELVIS FINDINGS Hepatobiliary: Cholelithiasis without signs of pericholecystic stranding. Signs of hepatic steatosis.  No contour abnormality of the liver. Pancreas: Normal contour without signs of adjacent inflammation. Spleen: Spleen normal size and contour. Adrenals/Urinary Tract: Normal adrenal glands. Perinephric stranding without hydronephrosis.  No ureteral calculi. Small low-density lesion arises from the lateral LEFT kidney approximately 14 mm. Measured density is not felt to  be reliable but is in the 12 Hounsfield unit range. 3 cm lesion with low-density spherical contours extending from cortex in the sinus fat in the lower pole. These lesions are referenced in a study from 2016, report available via care everywhere and the RIGHT renal lesion may have enlarged. Stomach/Bowel: Signs of sleeve gastrectomy. Patulous appearance of the stomach without signs of perigastric stranding. No signs of acute small bowel process. Normal appendix. Vascular/Lymphatic: Calcified atheromatous plaque of the abdominal aorta. Smooth contour of the IVC. There is no gastrohepatic or hepatoduodenal ligament lymphadenopathy. No retroperitoneal or mesenteric lymphadenopathy. Mild prominence of pelvic lymph nodes without frank pathologic enlargement. Some of these are top-normal size largest 1.3 cm, as large as 2.3 cm by report on previous imaging available also  from 2016. Again no images for this study are available. These are likely reactive. Reproductive: Unremarkable Other: Fat containing umbilical hernia is moderately large. Some stranding over the RIGHT body wall, extending off the field of view. Collateral venous pathways in the body wall. Musculoskeletal: Spinal degenerative changes. No acute or destructive bone process. IMPRESSION: 1. No definite acute findings in the chest, abdomen or pelvis. 2. No evidence of urinary tract calculi or obstruction. Mild bilateral perinephric stranding is nonspecific. Correlate with urinalysis 3. Signs of hepatic steatosis. 4. Cholelithiasis without signs of pericholecystic stranding. 5. Stranding over the RIGHT body wall is incompletely imaged. Correlate with any signs of cellulitis and or panniculitis. 6. Low-density renal lesions referenced on prior imaging reports, density of the lower pole lesion on the RIGHT is compatible with a cyst and is likely enlarged based on prior imaging reports. Density values are somewhat compromised by patient body habitus. 7. Signs of sleeve gastrectomy 8. Aortic atherosclerosis in coronary artery disease. Aortic Atherosclerosis (ICD10-I70.0). Electronically Signed   By: Zetta Bills M.D.   On: 01/20/2021 19:38   CT Renal Stone Study  Result Date: 01/20/2021 CLINICAL DATA:  Flank pain, suspected kidney stone in a patient with hypotension and chest pain. Also with acute kidney injury in this 55 year old male. EXAM: CT CHEST, ABDOMEN AND PELVIS WITHOUT CONTRAST TECHNIQUE: Multidetector CT imaging of the chest, abdomen and pelvis was performed following the standard protocol without IV contrast. COMPARISON:  None aside from chest x-ray from January 20, 2021. FINDINGS: CT CHEST FINDINGS Cardiovascular: Normal caliber thoracic aorta with scattered atherosclerosis. Three-vessel coronary artery disease. Normal heart size without pericardial effusion. Normal appearance of central pulmonary vasculature  in terms of caliber. Limited assessment of cardiovascular structures given lack of intravenous contrast. Mediastinum/Nodes: Esophagus mildly patulous. No mediastinal, thoracic inlet or axillary lymphadenopathy. No gross hilar lymphadenopathy. Lungs/Pleura: No signs of consolidation or evidence of pleural effusion. Airways are patent. Musculoskeletal: Spinal degenerative changes. No acute or destructive bone process. Visualized clavicles and scapulae as well sternum are unremarkable. CT ABDOMEN PELVIS FINDINGS Hepatobiliary: Cholelithiasis without signs of pericholecystic stranding. Signs of hepatic steatosis.  No contour abnormality of the liver. Pancreas: Normal contour without signs of adjacent inflammation. Spleen: Spleen normal size and contour. Adrenals/Urinary Tract: Normal adrenal glands. Perinephric stranding without hydronephrosis.  No ureteral calculi. Small low-density lesion arises from the lateral LEFT kidney approximately 14 mm. Measured density is not felt to be reliable but is in the 12 Hounsfield unit range. 3 cm lesion with low-density spherical contours extending from cortex in the sinus fat in the lower pole. These lesions are referenced in a study from 2016, report available via care everywhere and the RIGHT renal lesion may have enlarged. Stomach/Bowel: Signs of  sleeve gastrectomy. Patulous appearance of the stomach without signs of perigastric stranding. No signs of acute small bowel process. Normal appendix. Vascular/Lymphatic: Calcified atheromatous plaque of the abdominal aorta. Smooth contour of the IVC. There is no gastrohepatic or hepatoduodenal ligament lymphadenopathy. No retroperitoneal or mesenteric lymphadenopathy. Mild prominence of pelvic lymph nodes without frank pathologic enlargement. Some of these are top-normal size largest 1.3 cm, as large as 2.3 cm by report on previous imaging available also from 2016. Again no images for this study are available. These are likely  reactive. Reproductive: Unremarkable Other: Fat containing umbilical hernia is moderately large. Some stranding over the RIGHT body wall, extending off the field of view. Collateral venous pathways in the body wall. Musculoskeletal: Spinal degenerative changes. No acute or destructive bone process. IMPRESSION: 1. No definite acute findings in the chest, abdomen or pelvis. 2. No evidence of urinary tract calculi or obstruction. Mild bilateral perinephric stranding is nonspecific. Correlate with urinalysis 3. Signs of hepatic steatosis. 4. Cholelithiasis without signs of pericholecystic stranding. 5. Stranding over the RIGHT body wall is incompletely imaged. Correlate with any signs of cellulitis and or panniculitis. 6. Low-density renal lesions referenced on prior imaging reports, density of the lower pole lesion on the RIGHT is compatible with a cyst and is likely enlarged based on prior imaging reports. Density values are somewhat compromised by patient body habitus. 7. Signs of sleeve gastrectomy 8. Aortic atherosclerosis in coronary artery disease. Aortic Atherosclerosis (ICD10-I70.0). Electronically Signed   By: Zetta Bills M.D.   On: 01/20/2021 19:38    EKG: I independently viewed the EKG done and my findings are as followed: Sinus rhythm rate of 76.  Nonspecific ST-T changes.  QTc 424.  Assessment/Plan Present on Admission:  Near syncope  Active Problems:   Near syncope  Near syncope, suspect in the setting of orthostatic hypotension from dehydration Presented with complaints of lightheadedness dizziness, blurred vision CT head non acute Positive orthostatic vital signs Initially hypotensive with manual BP with EMS 60/40, responded well to IV fluid Continue IV fluid hydration and repeat orthostatic vital signs in the morning.  Elevated lactic acid likely secondary to hypovolemia Urine negative for pyuria Chest x-ray mild congestion, no evidence of pneumonia, personally reviewed ED CT  chest, abdomen pelvis no acute findings. Hold off metformin in the setting of renal insufficiency Lactic acid levels are improving with IV fluid Continue gentle IV fluid Repeat lactic acid level  Resolved hypotension likely secondary to hypovolemia Hold off home oral antihypertensive, including lisinopril and home Lasix BP 60/40 by EMS Latest BP 120/70 with MAP of 83 Maintain MAP greater than 65 Continue to monitor vital signs closely  Atypical non exertional chest pain, suspect musculoskeletal Troponin negative, cycle x2. No evidence of acute ischemia on twelve-lead EKG, no evidence of pericarditis No evidence of pneumonia on CT chest O2 saturation 100% on room air. Monitor on telemetry  AKI, prerenal in the setting of dehydration, hypovolemia. Baseline creatinine appears to be 1.0 GFR greater than 60 Presented with creatinine of 1.8 with GFR of 43 Monitor urine output Avoid nephrotoxic agent, dehydration and hypotension Start gentle IV fluid hydration normal saline at 50 cc/h x 1 day Repeat BMP in the morning  Right lower extremity mild cellulitis Keflex 500 mg 3 times daily x7 days.  Severe morbid obesity status post laparoscopic sleeve gastrectomy Resume home multivitamins, vitamin D3 and calcium  Type 2 diabetes with hyperglycemia Last hemoglobin A1c 8.8 on 01/16/2021 Presented with hyperglycemia Start Lantus 70 unit nightly  Resistance insulin sliding scale.  Elevated AST ALT Liver steatosis, cholelithiasis without signs of Cholecystic stranding on CT scan Closely monitor while on Simvastatin Repeat CMP in the AM  Polysubstance abuse including alcohol and cocaine No evidence of withdrawal at the time of this visit Obtain UDS  Abnormal findings on CT scan: -Mild Bilateral perinephric stranding is nonspecific. -Signs of hepatic steatosis. -Cholelithiasis without signs of pericholecystic stranding. -Stranding over the RIGHT body wall is incompletely  imaged. -Low-density renal lesions referenced on prior imaging reports, density of the lower pole lesion on the RIGHT is compatible with a cyst and is likely enlarged based on prior imaging reports. Density values are somewhat compromised by patient body habitus. -Signs of sleeve gastrectomy -Aortic atherosclerosis in coronary artery disease   DVT prophylaxis: Subcu Lovenox daily  Code Status: Full code  Family Communication: None at bedside  Disposition Plan: Admit to telemetry medical  Consults called: None  Admission status: Observation status   Status is: Observation    Dispo:  Patient From: Home  Planned Disposition: Home, possibly on 01/21/2021.  Medically stable for discharge: No      Kayleen Memos MD [facility] Pager [phone]  If 7PM-7AM, please contact night-coverage [email or web address] Password [facility]  01/20/2021, 8:27 PM

## 2021-01-20 NOTE — ED Notes (Signed)
Patient transported to CT 

## 2021-01-21 DIAGNOSIS — Z833 Family history of diabetes mellitus: Secondary | ICD-10-CM | POA: Diagnosis not present

## 2021-01-21 DIAGNOSIS — E785 Hyperlipidemia, unspecified: Secondary | ICD-10-CM | POA: Diagnosis present

## 2021-01-21 DIAGNOSIS — R0789 Other chest pain: Secondary | ICD-10-CM | POA: Diagnosis present

## 2021-01-21 DIAGNOSIS — N179 Acute kidney failure, unspecified: Secondary | ICD-10-CM | POA: Diagnosis present

## 2021-01-21 DIAGNOSIS — Z8673 Personal history of transient ischemic attack (TIA), and cerebral infarction without residual deficits: Secondary | ICD-10-CM | POA: Diagnosis not present

## 2021-01-21 DIAGNOSIS — F1010 Alcohol abuse, uncomplicated: Secondary | ICD-10-CM | POA: Diagnosis present

## 2021-01-21 DIAGNOSIS — F1410 Cocaine abuse, uncomplicated: Secondary | ICD-10-CM | POA: Diagnosis present

## 2021-01-21 DIAGNOSIS — R55 Syncope and collapse: Secondary | ICD-10-CM | POA: Diagnosis not present

## 2021-01-21 DIAGNOSIS — L03115 Cellulitis of right lower limb: Secondary | ICD-10-CM | POA: Diagnosis present

## 2021-01-21 DIAGNOSIS — E1165 Type 2 diabetes mellitus with hyperglycemia: Secondary | ICD-10-CM | POA: Diagnosis present

## 2021-01-21 DIAGNOSIS — Z79899 Other long term (current) drug therapy: Secondary | ICD-10-CM | POA: Diagnosis not present

## 2021-01-21 DIAGNOSIS — E860 Dehydration: Secondary | ICD-10-CM | POA: Diagnosis present

## 2021-01-21 DIAGNOSIS — E1142 Type 2 diabetes mellitus with diabetic polyneuropathy: Secondary | ICD-10-CM | POA: Diagnosis present

## 2021-01-21 DIAGNOSIS — Z9884 Bariatric surgery status: Secondary | ICD-10-CM | POA: Diagnosis not present

## 2021-01-21 DIAGNOSIS — N400 Enlarged prostate without lower urinary tract symptoms: Secondary | ICD-10-CM | POA: Diagnosis present

## 2021-01-21 DIAGNOSIS — Z7984 Long term (current) use of oral hypoglycemic drugs: Secondary | ICD-10-CM | POA: Diagnosis not present

## 2021-01-21 DIAGNOSIS — Z8249 Family history of ischemic heart disease and other diseases of the circulatory system: Secondary | ICD-10-CM | POA: Diagnosis not present

## 2021-01-21 DIAGNOSIS — G4733 Obstructive sleep apnea (adult) (pediatric): Secondary | ICD-10-CM | POA: Diagnosis present

## 2021-01-21 DIAGNOSIS — K8020 Calculus of gallbladder without cholecystitis without obstruction: Secondary | ICD-10-CM | POA: Diagnosis present

## 2021-01-21 DIAGNOSIS — N529 Male erectile dysfunction, unspecified: Secondary | ICD-10-CM | POA: Diagnosis present

## 2021-01-21 DIAGNOSIS — Z20822 Contact with and (suspected) exposure to covid-19: Secondary | ICD-10-CM | POA: Diagnosis present

## 2021-01-21 DIAGNOSIS — E861 Hypovolemia: Secondary | ICD-10-CM | POA: Diagnosis present

## 2021-01-21 DIAGNOSIS — I951 Orthostatic hypotension: Secondary | ICD-10-CM | POA: Diagnosis present

## 2021-01-21 LAB — COMPREHENSIVE METABOLIC PANEL
ALT: 68 U/L — ABNORMAL HIGH (ref 0–44)
AST: 36 U/L (ref 15–41)
Albumin: 2.8 g/dL — ABNORMAL LOW (ref 3.5–5.0)
Alkaline Phosphatase: 52 U/L (ref 38–126)
Anion gap: 4 — ABNORMAL LOW (ref 5–15)
BUN: 28 mg/dL — ABNORMAL HIGH (ref 6–20)
CO2: 23 mmol/L (ref 22–32)
Calcium: 9 mg/dL (ref 8.9–10.3)
Chloride: 108 mmol/L (ref 98–111)
Creatinine, Ser: 1.34 mg/dL — ABNORMAL HIGH (ref 0.61–1.24)
GFR, Estimated: >60 mL/min (ref >60)
Glucose, Bld: 207 mg/dL — ABNORMAL HIGH (ref 70–99)
Potassium: 4.5 mmol/L (ref 3.5–5.1)
Sodium: 135 mmol/L (ref 135–145)
Total Bilirubin: 0.8 mg/dL (ref 0.3–1.2)
Total Protein: 6.1 g/dL — ABNORMAL LOW (ref 6.5–8.1)

## 2021-01-21 LAB — CBC
HCT: 33.6 % — ABNORMAL LOW (ref 39.0–52.0)
Hemoglobin: 11.5 g/dL — ABNORMAL LOW (ref 13.0–17.0)
MCH: 34.5 pg — ABNORMAL HIGH (ref 26.0–34.0)
MCHC: 34.2 g/dL (ref 30.0–36.0)
MCV: 100.9 fL — ABNORMAL HIGH (ref 80.0–100.0)
Platelets: 240 10*3/uL (ref 150–400)
RBC: 3.33 MIL/uL — ABNORMAL LOW (ref 4.22–5.81)
RDW: 11.4 % — ABNORMAL LOW (ref 11.5–15.5)
WBC: 6.6 10*3/uL (ref 4.0–10.5)
nRBC: 0 % (ref 0.0–0.2)

## 2021-01-21 LAB — GLUCOSE, CAPILLARY
Glucose-Capillary: 277 mg/dL — ABNORMAL HIGH (ref 70–99)
Glucose-Capillary: 184 mg/dL — ABNORMAL HIGH (ref 70–99)
Glucose-Capillary: 166 mg/dL — ABNORMAL HIGH (ref 70–99)
Glucose-Capillary: 179 mg/dL — ABNORMAL HIGH (ref 70–99)

## 2021-01-21 MED ORDER — ENOXAPARIN SODIUM 100 MG/ML IJ SOSY
95.0000 mg | PREFILLED_SYRINGE | INTRAMUSCULAR | Status: DC
  Administered 2021-01-21: 95 mg via SUBCUTANEOUS
  Filled 2021-01-21 (×2): qty 1

## 2021-01-21 MED ORDER — URSODIOL 300 MG PO CAPS
300.0000 mg | ORAL_CAPSULE | Freq: Two times a day (BID) | ORAL | Status: DC
  Administered 2021-01-21 – 2021-01-22 (×4): 300 mg via ORAL
  Filled 2021-01-21 (×5): qty 1

## 2021-01-21 NOTE — Evaluation (Addendum)
Physical Therapy Evaluation Patient Details Name: Jeremy Bauer MRN: [id number] DOB: [birth year] Today's Date: 01/21/2021   History of Present Illness  The pt is a 55 yo male presenting 7/15 with c/o blurred vision and hypotension. PMH includes: morbid obesity s/p laparoscopic sleeve gastrectomy, DM II, polyneuropathy, BPH, HTN, OSA, and polysubstance abuse.   Clinical Impression  Pt in bed upon arrival of PT, agreeable to evaluation at this time. Prior to admission the pt was mobilizing without use of DME or assist, but reports he is limited to 30-50 ft due to fatigue and weakness. The pt lives at [facility] where meals are provided but he is responsible for all mobility and required to be out of the facility outside of meal times. The pt now presents with limitations in functional mobility, power, strength, stability, and activity tolerance due to above dx, and will continue to benefit from skilled PT to address these deficits. The pt was able to complete multiple sit-stand transfers without physical assist or DME as well as complete 200 ft hallway ambulation without need for physical assist or DME. The pt does fatigue quickly, requiring multiple standing rest breaks, and will benefit from maximal OOB mobility and ambulation with staff to further progress activity tolerance and mobility. The pt was educated on progression of home walking program, and the pt showed therapist some options he has been looking at for aquatic exercise in the community. Discussed importance for increased hydration and nutrition with return to exercise and the pt verbalized understanding. Will continue to benefit from skilled PT acutely to progress power, strength, and endurance.   BP remained stable with all changes in position and activity during this session.      Follow Up Recommendations Home health PT;Supervision for mobility/OOB    Equipment Recommendations  Other (comment) (bariatric rollator)     Recommendations for Other Services       Precautions / Restrictions Precautions Precautions: Fall Restrictions Weight Bearing Restrictions: No      Mobility  Bed Mobility Overal bed mobility: Independent                  Transfers Overall transfer level: Needs assistance Equipment used: None Transfers: Sit to/from Stand Sit to Stand: Supervision         General transfer comment: supervision for safety, increased time and effort to power up to standing. pt with significant use of momentum  Ambulation/Gait Ambulation/Gait assistance: Min guard Gait Distance (Feet): 200 Feet Assistive device: None Gait Pattern/deviations: Step-through pattern;Wide base of support Gait velocity: 0.5 m/s Gait velocity interpretation: 1.31 - 2.62 ft/sec, indicative of limited community ambulator General Gait Details: pt with wide BOS and increased lateral movment but no overt LOB. fatigues quickly with need for single UE support and standing rest after 150 ft      Balance Overall balance assessment: Mild deficits observed, not formally tested                                           Pertinent Vitals/Pain Pain Assessment: No/denies pain    Home Living Family/patient expects to be discharged to:: Shelter/Homeless [facility])   Available Help at Discharge: Available PRN/intermittently;Friend(s)   Home Access: Level entry     Home Layout: Multi-level Home Equipment: Shower seat;Grab bars - toilet;Grab bars - tub/shower      Prior Function Level of Independence: Independent  Comments: pt independent, no longer has access to DME. limited in ambulation distance due to fagitue. [facility] provides meals     Hand Dominance   Dominant Hand: Right    Extremity/Trunk Assessment   Upper Extremity Assessment Upper Extremity Assessment: Overall WFL for tasks assessed    Lower Extremity Assessment Lower Extremity Assessment: Generalized  weakness    Cervical / Trunk Assessment Cervical / Trunk Assessment: Other exceptions Cervical / Trunk Exceptions: large body habitus  Communication   Communication: No difficulties  Cognition Arousal/Alertness: Awake/alert Behavior During Therapy: WFL for tasks assessed/performed Overall Cognitive Status: Within Functional Limits for tasks assessed                                 General Comments: pt tangential with decreased insight to deficits. poor medical understanding, but likely baseline      General Comments General comments (skin integrity, edema, etc.): VSS on RA. HR to 100s with activity. SpO2 in 90s with all ambulation    Exercises     Assessment/Plan    PT Assessment Patient needs continued PT services  PT Problem List Decreased strength;Decreased activity tolerance;Decreased balance;Decreased mobility;Obesity       PT Treatment Interventions DME instruction;Gait training;Stair training;Functional mobility training;Therapeutic activities;Therapeutic exercise;Balance training;Patient/family education    PT Goals (Current goals can be found in the Care Plan section)  Acute Rehab PT Goals Patient Stated Goal: start aquatic exercise program PT Goal Formulation: With patient Time For Goal Achievement: 02/04/21 Potential to Achieve Goals: Good    Frequency Min 3X/week    AM-PAC PT "6 Clicks" Mobility  Outcome Measure Help needed turning from your back to your side while in a flat bed without using bedrails?: None Help needed moving from lying on your back to sitting on the side of a flat bed without using bedrails?: None Help needed moving to and from a bed to a chair (including a wheelchair)?: A Little Help needed standing up from a chair using your arms (e.g., wheelchair or bedside chair)?: A Little Help needed to walk in hospital room?: A Little Help needed climbing 3-5 steps with a railing? : A Little 6 Click Score: 20    End of Session  Equipment Utilized During Treatment: Gait belt Activity Tolerance: Patient tolerated treatment well Patient left: in chair;with call bell/phone within reach;with chair alarm set Nurse Communication: Mobility status PT Visit Diagnosis: Other abnormalities of gait and mobility (R26.89)    Time: 3536-1443 PT Time Calculation (min) (ACUTE ONLY): 40 min   Charges:   PT Evaluation $PT Eval Low Complexity: 1 Low PT Treatments $Gait Training: 8-22 mins $Therapeutic Activity: 8-22 mins        Lazarus Gowda, PT, DPT   Acute Rehabilitation Department Pager #: [phone]  Gaetana Michaelis 01/21/2021, 12:18 PM

## 2021-01-21 NOTE — Progress Notes (Signed)
PROGRESS NOTE    Jeremy Bauer  DJS:[id number] DOB: 1965-12-09 DOA: 01/20/2021 PCP: Storm Frisk, MD   Chief Complain: Lightheadedness, fatigue, dizziness, chest pain  Brief Narrative: Patient is a 55 year old male with history of CVA morbid obesity status post laparoscopic sleeve gastrectomy, type 2 diabetes, diabetic polyneuropathy, BPH, polysubstance abuse: Alcohol/cocaine, hypertension, OSA who presented with complaints of lightheadedness, fatigue, dizziness, intermittent chest pain.  On presentation, he was severely hypertensive with blood pressure of 60/40.  He was also complaining of intermittent chest pain.  Chest pain work-up was recently done at ED at [facility].  On the admission day, he felt dizzy and he alerted the shelter manager and EMS was activated.  On presentation, lactation was 2.7, creatinine was 1.8, he was hypoglycemic, also had elevated liver enzymes.  Patient was admitted for the management of AKI, presyncope, hypotension  Assessment & Plan:   Active Problems:   Near syncope   Near syncopal/hypotension: Most likely secondary to orthostatic hypotension from dehydration.  He was complaining of lightheadedness, dizziness, blurred vision.  CT head did not show any acute changes.  Positive orthostatic vital signs in the emergency department.  She was hypotensive in presentation.  Blood pressures improved with IV fluids.  Continue IV fluid We will also request PT/OT evaluation.  We will recheck orthostatics  Elevated lactic acid/AKI: Secondary to hypovolemia.  He is baseline creatinine is normal.  Kidney function improving with IV fluid.  Atypical nonexertional chest pain: Suspect musculoskeletal chest pain.  Pain is reproducible on chest palpation .troponins negative.  EKG did not show any signs of ischemia.  No evidence of pneumonia as per CT.  Respiratory status is stable.  Mild cellulitis of right lower extremity: Started on Keflex, there is no significant  cellulitis on examination today.  Type 2 diabetes with hyperglycemia: Last hemoglobin A1c of 8.8 as per 01/16/2021. Takes metformin at home, recently started on Trulicity.  Continue current insulin regimen.  Monitor blood sugars Most likely will add glipizide on discharge  Slightly elevated AST/ALT: CT scan showed hepatic steatosis, cholelithiasis without signs of cholecystitis.  Patient is also on simvastatin.  Improved  Polysubstance abuse: Takes Alcohol/cocaine.  Counseled for cessation  Bilateral perinephric stranding on CT scan: Denies any abdominal pain.  No evidence of pyelonephritis.              DVT prophylaxis:Lovenox Code Status: Full Family Communication: None at bedside Status is: Observation    Dispo:  Patient From: Home  Planned Disposition: Home  Medically stable for discharge: No   Plan for discharge tomorrow after PT/OT evaluation, improvement in the kidney function.    Consultants: None  Procedures:None  Antimicrobials:  Anti-infectives (From admission, onward)    Start     Dose/Rate Route Frequency Ordered Stop   01/20/21 2215  cephALEXin (KEFLEX) capsule 500 mg        500 mg Oral Every 8 hours 01/20/21 2207 01/27/21 2159       Subjective:  Patient seen and examined the bedside this morning. Hemodynamically stable. feels comfortable today.  Denies any new complaints.  He is anxious about his diabetes.   Objective: Vitals:   01/20/21 2145 01/20/21 2200 01/20/21 2307 01/21/21 0604  BP: (!) 102/58 105/87 127/79 98/86  Pulse: 79 82 76 75  Resp: 17 17 16 16   Temp:   98.3 F (36.8 C) 97.7 F (36.5 C)  TempSrc:      SpO2: 98% 98% 100% 100%  Weight:  Height:        Intake/Output Summary (Last 24 hours) at 01/21/2021 0750 Last data filed at 01/21/2021 0748 Gross per 24 hour  Intake 171.34 ml  Output 1600 ml  Net -1428.66 ml   Filed Weights   01/20/21 1321  Weight: (!) 192.8 kg    Examination:  General exam: Overall  comfortable, not in distress, morbidly obese HEENT: PERRL Respiratory system:  no wheezes or crackles  Cardiovascular system: S1 & S2 heard, RRR.  Gastrointestinal system: Abdomen is nondistended, soft and nontender. Central nervous system: Alert and oriented Extremities: No edema, no clubbing ,no cyanosis, chronic skin changes on bilateral lower extremities more on the right Skin: No rashes, no ulcers,no icterus      Data Reviewed: I have personally reviewed following labs and imaging studies  CBC: Recent Labs  Lab 01/20/21 1336 01/21/21 0204  WBC 6.9 6.6  HGB 12.6* 11.5*  HCT 37.8* 33.6*  MCV 102.4* 100.9*  PLT 255 240   Basic Metabolic Panel: Recent Labs  Lab 01/16/21 1447 01/20/21 1614 01/21/21 0204  NA 135 133* 135  K 5.0 4.8 4.5  CL 101 107 108  CO2 GLUCOSE 318* 264* 207*  BUN 20 35* 28*  CREATININE 1.03 1.84* 1.34*  CALCIUM 9.6 8.7* 9.0   GFR: Estimated Creatinine Clearance: 110.2 mL/min (A) (by C-G formula based on SCr of 1.34 mg/dL (H)). Liver Function Tests: Recent Labs  Lab 01/16/21 1447 01/20/21 1614 01/21/21 0204  AST 33 44* 36  ALT 78* 70* 68*  ALKPHOS 79 49 52  BILITOT 0.3 0.7 0.8  PROT 7.4 6.1* 6.1*  ALBUMIN 3.9 2.9* 2.8*   No results for input(s): LIPASE, AMYLASE in the last 168 hours. No results for input(s): AMMONIA in the last 168 hours. Coagulation Profile: No results for input(s): INR, PROTIME in the last 168 hours. Cardiac Enzymes: No results for input(s): CKTOTAL, CKMB, CKMBINDEX, TROPONINI in the last 168 hours. BNP (last 3 results) No results for input(s): PROBNP in the last 8760 hours. HbA1C: No results for input(s): HGBA1C in the last 72 hours. CBG: Recent Labs  Lab 01/20/21 1435 01/20/21 2040 01/20/21 2218 01/20/21 2308 01/21/21 0744  GLUCAP 128* 296* 334* 277* 277*   Lipid Profile: No results for input(s): CHOL, HDL, LDLCALC, TRIG, CHOLHDL, LDLDIRECT in the last 72 hours. Thyroid Function Tests: No  results for input(s): TSH, T4TOTAL, FREET4, T3FREE, THYROIDAB in the last 72 hours. Anemia Panel: No results for input(s): VITAMINB12, FOLATE, FERRITIN, TIBC, IRON, RETICCTPCT in the last 72 hours. Sepsis Labs: Recent Labs  Lab 01/20/21 1341 01/20/21 1541  LATICACIDVEN 2.7* 2.1*    Recent Results (from the past 240 hour(s))  Resp Panel by RT-PCR (Flu A&B, Covid) Nasopharyngeal Swab     Status: None   Collection Time: 01/20/21  3:51 PM   Specimen: Nasopharyngeal Swab; Nasopharyngeal(NP) swabs in vial transport medium  Result Value Ref Range Status   SARS Coronavirus 2 by RT PCR NEGATIVE NEGATIVE Final    Comment: (NOTE) SARS-CoV-2 target nucleic acids are NOT DETECTED.  The SARS-CoV-2 RNA is generally detectable in upper respiratory specimens during the acute phase of infection. The lowest concentration of SARS-CoV-2 viral copies this assay can detect is 138 copies/mL. A negative result does not preclude SARS-Cov-2 infection and should not be used as the sole basis for treatment or other patient management decisions. A negative result may occur with  improper specimen collection/handling, submission of specimen other than nasopharyngeal swab, presence of viral  mutation(s) within the areas targeted by this assay, and inadequate number of viral copies(<138 copies/mL). A negative result must be combined with clinical observations, patient history, and epidemiological information. The expected result is Negative.  Fact Sheet for Patients:  BloggerCourse.com  Fact Sheet for Healthcare Providers:  SeriousBroker.it  This test is no t yet approved or cleared by the [place] FDA and  has been authorized for detection and/or diagnosis of SARS-CoV-2 by FDA under an Emergency Use Authorization (EUA). This EUA will remain  in effect (meaning this test can be used) for the duration of the COVID-19 declaration under Section 564(b)(1) of  the Act, 21 U.S.C.section 360bbb-3(b)(1), unless the authorization is terminated  or revoked sooner.       Influenza A by PCR NEGATIVE NEGATIVE Final   Influenza B by PCR NEGATIVE NEGATIVE Final    Comment: (NOTE) The Xpert Xpress SARS-CoV-2/FLU/RSV plus assay is intended as an aid in the diagnosis of influenza from Nasopharyngeal swab specimens and should not be used as a sole basis for treatment. Nasal washings and aspirates are unacceptable for Xpert Xpress SARS-CoV-2/FLU/RSV testing.  Fact Sheet for Patients: BloggerCourse.com  Fact Sheet for Healthcare Providers: SeriousBroker.it  This test is not yet approved or cleared by the [place] FDA and has been authorized for detection and/or diagnosis of SARS-CoV-2 by FDA under an Emergency Use Authorization (EUA). This EUA will remain in effect (meaning this test can be used) for the duration of the COVID-19 declaration under Section 564(b)(1) of the Act, 21 U.S.C. section 360bbb-3(b)(1), unless the authorization is terminated or revoked.  Performed at [facility], [address] [place], [place] [address]          Radiology Studies: DG Chest 2 View  Result Date: 01/20/2021 CLINICAL DATA:  Chest pain. EXAM: CHEST - 2 VIEW COMPARISON:  None. FINDINGS: Low lung volumes. The cardio pericardial silhouette is enlarged. There is pulmonary vascular congestion without overt pulmonary edema. No focal airspace consolidation or pleural effusion. Telemetry leads overlie the chest. IMPRESSION: Low volume film with cardiomegaly and vascular congestion. Electronically Signed   By: Kennith Center M.D.   On: 01/20/2021 14:33   CT Head Wo Contrast  Result Date: 01/20/2021 CLINICAL DATA:  Dizziness, hypertension EXAM: CT HEAD WITHOUT CONTRAST TECHNIQUE: Contiguous axial images were obtained from the base of the skull through the vertex without intravenous contrast. COMPARISON:   None. FINDINGS: Brain: No evidence of acute infarction, hemorrhage, hydrocephalus, extra-axial collection or mass lesion/mass effect. Vascular: Atherosclerotic calcifications involving the large vessels of the skull base. No unexpected hyperdense vessel. Skull: Normal. Negative for fracture or focal lesion. Sinuses/Orbits: No acute finding. Other: Ill-defined soft tissue density within the suboccipital soft tissues seen only on axial images. This area measures approximately 2.7 x 2.2 cm (series 3, image 5) IMPRESSION: 1. No acute intracranial findings. 2. Ill-defined soft tissue density within the midline suboccipital soft tissues measuring approximately 2.7 x 2.2 cm. Findings are nonspecific and could represent a hematoma, scarring, or potentially a soft tissue mass. Correlate with physical exam. Any prior outside cross-sectional imaging of the head or neck would be helpful to assess the stability of this finding. Electronically Signed   By: Duanne Guess D.O.   On: 01/20/2021 15:51   CT Chest Wo Contrast  Result Date: 01/20/2021 CLINICAL DATA:  Flank pain, suspected kidney stone in a patient with hypotension and chest pain. Also with acute kidney injury in this 55 year old male. EXAM: CT CHEST, ABDOMEN AND PELVIS  WITHOUT CONTRAST TECHNIQUE: Multidetector CT imaging of the chest, abdomen and pelvis was performed following the standard protocol without IV contrast. COMPARISON:  None aside from chest x-ray from January 20, 2021. FINDINGS: CT CHEST FINDINGS Cardiovascular: Normal caliber thoracic aorta with scattered atherosclerosis. Three-vessel coronary artery disease. Normal heart size without pericardial effusion. Normal appearance of central pulmonary vasculature in terms of caliber. Limited assessment of cardiovascular structures given lack of intravenous contrast. Mediastinum/Nodes: Esophagus mildly patulous. No mediastinal, thoracic inlet or axillary lymphadenopathy. No gross hilar lymphadenopathy.  Lungs/Pleura: No signs of consolidation or evidence of pleural effusion. Airways are patent. Musculoskeletal: Spinal degenerative changes. No acute or destructive bone process. Visualized clavicles and scapulae as well sternum are unremarkable. CT ABDOMEN PELVIS FINDINGS Hepatobiliary: Cholelithiasis without signs of pericholecystic stranding. Signs of hepatic steatosis.  No contour abnormality of the liver. Pancreas: Normal contour without signs of adjacent inflammation. Spleen: Spleen normal size and contour. Adrenals/Urinary Tract: Normal adrenal glands. Perinephric stranding without hydronephrosis.  No ureteral calculi. Small low-density lesion arises from the lateral LEFT kidney approximately 14 mm. Measured density is not felt to be reliable but is in the 12 Hounsfield unit range. 3 cm lesion with low-density spherical contours extending from cortex in the sinus fat in the lower pole. These lesions are referenced in a study from 2016, report available via care everywhere and the RIGHT renal lesion may have enlarged. Stomach/Bowel: Signs of sleeve gastrectomy. Patulous appearance of the stomach without signs of perigastric stranding. No signs of acute small bowel process. Normal appendix. Vascular/Lymphatic: Calcified atheromatous plaque of the abdominal aorta. Smooth contour of the IVC. There is no gastrohepatic or hepatoduodenal ligament lymphadenopathy. No retroperitoneal or mesenteric lymphadenopathy. Mild prominence of pelvic lymph nodes without frank pathologic enlargement. Some of these are top-normal size largest 1.3 cm, as large as 2.3 cm by report on previous imaging available also from 2016. Again no images for this study are available. These are likely reactive. Reproductive: Unremarkable Other: Fat containing umbilical hernia is moderately large. Some stranding over the RIGHT body wall, extending off the field of view. Collateral venous pathways in the body wall. Musculoskeletal: Spinal  degenerative changes. No acute or destructive bone process. IMPRESSION: 1. No definite acute findings in the chest, abdomen or pelvis. 2. No evidence of urinary tract calculi or obstruction. Mild bilateral perinephric stranding is nonspecific. Correlate with urinalysis 3. Signs of hepatic steatosis. 4. Cholelithiasis without signs of pericholecystic stranding. 5. Stranding over the RIGHT body wall is incompletely imaged. Correlate with any signs of cellulitis and or panniculitis. 6. Low-density renal lesions referenced on prior imaging reports, density of the lower pole lesion on the RIGHT is compatible with a cyst and is likely enlarged based on prior imaging reports. Density values are somewhat compromised by patient body habitus. 7. Signs of sleeve gastrectomy 8. Aortic atherosclerosis in coronary artery disease. Aortic Atherosclerosis (ICD10-I70.0). Electronically Signed   By: Donzetta KohutGeoffrey  Wile M.D.   On: 01/20/2021 19:38   CT Renal Stone Study  Result Date: 01/20/2021 CLINICAL DATA:  Flank pain, suspected kidney stone in a patient with hypotension and chest pain. Also with acute kidney injury in this 55 year old male. EXAM: CT CHEST, ABDOMEN AND PELVIS WITHOUT CONTRAST TECHNIQUE: Multidetector CT imaging of the chest, abdomen and pelvis was performed following the standard protocol without IV contrast. COMPARISON:  None aside from chest x-ray from January 20, 2021. FINDINGS: CT CHEST FINDINGS Cardiovascular: Normal caliber thoracic aorta with scattered atherosclerosis. Three-vessel coronary artery disease. Normal heart size without pericardial  effusion. Normal appearance of central pulmonary vasculature in terms of caliber. Limited assessment of cardiovascular structures given lack of intravenous contrast. Mediastinum/Nodes: Esophagus mildly patulous. No mediastinal, thoracic inlet or axillary lymphadenopathy. No gross hilar lymphadenopathy. Lungs/Pleura: No signs of consolidation or evidence of pleural effusion.  Airways are patent. Musculoskeletal: Spinal degenerative changes. No acute or destructive bone process. Visualized clavicles and scapulae as well sternum are unremarkable. CT ABDOMEN PELVIS FINDINGS Hepatobiliary: Cholelithiasis without signs of pericholecystic stranding. Signs of hepatic steatosis.  No contour abnormality of the liver. Pancreas: Normal contour without signs of adjacent inflammation. Spleen: Spleen normal size and contour. Adrenals/Urinary Tract: Normal adrenal glands. Perinephric stranding without hydronephrosis.  No ureteral calculi. Small low-density lesion arises from the lateral LEFT kidney approximately 14 mm. Measured density is not felt to be reliable but is in the 12 Hounsfield unit range. 3 cm lesion with low-density spherical contours extending from cortex in the sinus fat in the lower pole. These lesions are referenced in a study from 2016, report available via care everywhere and the RIGHT renal lesion may have enlarged. Stomach/Bowel: Signs of sleeve gastrectomy. Patulous appearance of the stomach without signs of perigastric stranding. No signs of acute small bowel process. Normal appendix. Vascular/Lymphatic: Calcified atheromatous plaque of the abdominal aorta. Smooth contour of the IVC. There is no gastrohepatic or hepatoduodenal ligament lymphadenopathy. No retroperitoneal or mesenteric lymphadenopathy. Mild prominence of pelvic lymph nodes without frank pathologic enlargement. Some of these are top-normal size largest 1.3 cm, as large as 2.3 cm by report on previous imaging available also from 2016. Again no images for this study are available. These are likely reactive. Reproductive: Unremarkable Other: Fat containing umbilical hernia is moderately large. Some stranding over the RIGHT body wall, extending off the field of view. Collateral venous pathways in the body wall. Musculoskeletal: Spinal degenerative changes. No acute or destructive bone process. IMPRESSION: 1. No  definite acute findings in the chest, abdomen or pelvis. 2. No evidence of urinary tract calculi or obstruction. Mild bilateral perinephric stranding is nonspecific. Correlate with urinalysis 3. Signs of hepatic steatosis. 4. Cholelithiasis without signs of pericholecystic stranding. 5. Stranding over the RIGHT body wall is incompletely imaged. Correlate with any signs of cellulitis and or panniculitis. 6. Low-density renal lesions referenced on prior imaging reports, density of the lower pole lesion on the RIGHT is compatible with a cyst and is likely enlarged based on prior imaging reports. Density values are somewhat compromised by patient body habitus. 7. Signs of sleeve gastrectomy 8. Aortic atherosclerosis in coronary artery disease. Aortic Atherosclerosis (ICD10-I70.0). Electronically Signed   By: Donzetta Kohut M.D.   On: 01/20/2021 19:38        Scheduled Meds:  calcium-vitamin D  1 tablet Oral BID   cephALEXin  500 mg Oral Q8H   enoxaparin (LOVENOX) injection  40 mg Subcutaneous Q24H   insulin aspart  0-20 Units Subcutaneous TID WC   insulin aspart  0-5 Units Subcutaneous QHS   insulin glargine  7 Units Subcutaneous QHS   multivitamin with minerals  1 tablet Oral Daily   nystatin  1 application Topical TID   polyethylene glycol  17 g Oral Daily   pregabalin  225 mg Oral BID   simvastatin  20 mg Oral Daily   tamsulosin  0.4 mg Oral Daily   topiramate  100 mg Oral BID   traZODone  150 mg Oral QHS   triamcinolone cream  1 application Topical BID   ursodiol  300 mg Oral BID  Continuous Infusions:  sodium chloride 50 mL/hr at 01/20/21 2334     LOS: 0 days    Time spent: 35 mins.More than 50% of that time was spent in counseling and/or coordination of care.      Burnadette Pop, MD [facility] P7/16/2022, 7:50 AM

## 2021-01-21 NOTE — Plan of Care (Signed)
°  Problem: Clinical Measurements: °Goal: Ability to maintain clinical measurements within normal limits will improve °Outcome: Progressing °  °Problem: Clinical Measurements: °Goal: Diagnostic test results will improve °Outcome: Progressing °  °

## 2021-01-22 LAB — BASIC METABOLIC PANEL
Anion gap: 3 — ABNORMAL LOW (ref 5–15)
BUN: 20 mg/dL (ref 6–20)
CO2: 25 mmol/L (ref 22–32)
Calcium: 9 mg/dL (ref 8.9–10.3)
Chloride: 108 mmol/L (ref 98–111)
Creatinine, Ser: 1.19 mg/dL (ref 0.61–1.24)
GFR, Estimated: >60 mL/min (ref >60)
Glucose, Bld: 223 mg/dL — ABNORMAL HIGH (ref 70–99)
Potassium: 4.5 mmol/L (ref 3.5–5.1)
Sodium: 136 mmol/L (ref 135–145)

## 2021-01-22 LAB — GLUCOSE, CAPILLARY
Glucose-Capillary: 199 mg/dL — ABNORMAL HIGH (ref 70–99)
Glucose-Capillary: 174 mg/dL — ABNORMAL HIGH (ref 70–99)
Glucose-Capillary: 162 mg/dL — ABNORMAL HIGH (ref 70–99)

## 2021-01-22 MED ORDER — GLIPIZIDE 5 MG PO TABS
5.0000 mg | ORAL_TABLET | Freq: Two times a day (BID) | ORAL | 0 refills | Status: DC
  Filled 2021-01-22: qty 60, 30d supply, fill #0

## 2021-01-22 NOTE — TOC Transition Note (Addendum)
Transition of Care [facility]) - CM/SW Discharge Note   Patient Details  Name: Jeremy Bauer MRN: [id number] Date of Birth: June 13, 1966  [facility] of [facility]) CM/SW Contact:  Lawerance Sabal, RN Phone Number: 01/22/2021, 10:44 AM   Clinical Narrative:    Verified w HH agencies that Medicaid would not approve [facility] services for activity intolerance, fatigue.   Patient is 424 pounds and was able to complete multiple sit-stand transfers without physical assist or DME as well as complete 200 ft hallway ambulation without need for physical assist or DME.  He has [facility] on hold, while he is getting his services transferred to [facility]. This process has already been started.   Patient asked that I check with his brother Jeremy Bauer on status of bari rollator ordered by Dr Jonni Sanger. It may have been delivered to his house.  Jeremy Bauer confirms that bari rollator has been delivered to his house. He is going to work on getting it to the patient in the next few days.  Patient will DC back to [facility] after 12:00 today via cab.     Final next level of care: Home/Self Care Barriers to Discharge: No Barriers Identified   Patient Goals and CMS Choice Patient states their goals for this hospitalization and ongoing recovery are:: [facility]      Discharge Placement                       Discharge Plan and Services                                     Social Determinants of Health (SDOH) Interventions     Readmission Risk Interventions No flowsheet data found.

## 2021-01-22 NOTE — Discharge Summary (Signed)
Physician Discharge Summary  Jeremy Bauer WYO:[id number] DOB: [birth year] DOA: 01/20/2021  PCP: Elsie Stain, MD  Admit date: 01/20/2021 Discharge date: 01/22/2021  Admitted From: Home Disposition:  Home  Discharge Condition:Stable CODE STATUS:FULL Diet recommendation:  Carb Modified   Brief/Interim Summary: Patient is a 55 year old male with history of CVA morbid obesity status post laparoscopic sleeve gastrectomy, type 2 diabetes, diabetic polyneuropathy, BPH, polysubstance abuse: Alcohol/cocaine, hypertension, OSA who presented with complaints of lightheadedness, fatigue, dizziness, intermittent chest pain.  On presentation, he was severely hypertensive with blood pressure of 60/40.  He was also complaining of intermittent chest pain.  Chest pain work-up was recently done at ED at [facility].  On the admission day, he felt dizzy and he alerted the shelter manager and EMS was activated.  On presentation, lactation was 2.7, creatinine was 1.8, he was hypoglycemic, also had elevated liver enzymes.  Patient was admitted for the management of AKI, presyncope, hypotension. His AKI, hypotension resolved with IV fluids.  PT evaluated him and he was not orthostatic, PT recommended home health.  Patient is medically stable for discharge home today.  Following problems were addressed during his hospitalization:  Near syncopal/hypotension: Most likely secondary to orthostatic hypotension from dehydration.  He was complaining of lightheadedness, dizziness, blurred vision.  CT head did not show any acute changes.  Positive orthostatic vital signs in the emergency department.  She was hypotensive in presentation.  Blood pressures improved with IV fluids.   His AKI, hypotension resolved with IV fluids.  PT evaluated him and he was not orthostatic, PT recommended home health.   Elevated lactic acid/AKI: Secondary to hypovolemia.  He is baseline creatinine is normal.  Kidney function improving with IV  fluid.   Atypical nonexertional chest pain: Suspect musculoskeletal chest pain.  Pain is reproducible on chest palpation .troponins negative.  EKG did not show any signs of ischemia.  No evidence of pneumonia as per CT.  Respiratory status is stable.  He can follow-up with his PCP for possible outpatient nuclear stress test.   Mild cellulitis of right lower extremity: Started on Keflex, there is no significant cellulitis on examination today,abx d/ced   Type 2 diabetes with hyperglycemia: Last hemoglobin A1c of 8.8 as per 01/16/2021. Takes metformin at home, recently started on Trulicity.  Continue current insulin regimen.  Monitor blood sugars We will add glipizide on discharge   Slightly elevated AST/ALT: CT scan showed hepatic steatosis, cholelithiasis without signs of cholecystitis.  Patient is also on simvastatin.  Improved   Polysubstance abuse: Takes Alcohol/cocaine.  Counseled for cessation   Bilateral perinephric stranding on CT scan: Denies any abdominal pain.  No evidence of pyelonephritis.         Discharge Diagnoses:  Active Problems:   Near syncope    Discharge Instructions  Discharge Instructions     Diet Carb Modified   Complete by: As directed    Discharge instructions   Complete by: As directed    1)Please follow-up with your PCP in a week 2)Take prescribed medications as instructed 3)Monitor ur blood glucose at home   Increase activity slowly   Complete by: As directed       Allergies as of 01/22/2021   No Known Allergies      Medication List     TAKE these medications    Accu-Chek Guide test strip Generic drug: glucose blood use as directed   Accu-Chek Guide w/Device Kit use as directed   Accu-Chek Softclix Lancets lancets Use as  directed   Calcium-Vitamin D 600-400 MG-UNIT Tabs Take 1 tablet by mouth 2 (two) times daily.   furosemide 40 MG tablet Commonly known as: LASIX Take 1 tablet (40 mg total) by mouth daily as needed. What  changed: when to take this   glipiZIDE 5 MG tablet Commonly known as: GLUCOTROL Take 1 tablet (5 mg total) by mouth 2 (two) times daily.   lisinopril 30 MG tablet Commonly known as: ZESTRIL Take 1 tablet (30 mg total) by mouth daily.   meloxicam 15 MG tablet Commonly known as: MOBIC Take 1 tablet (15 mg total) by mouth daily.   metFORMIN 1000 MG tablet Commonly known as: GLUCOPHAGE Take 1,000 mg by mouth 2 (two) times daily with a meal.   MULTIVITAMIN ADULT PO Take 1 tablet by mouth daily.   nystatin powder Commonly known as: nystatin Apply 1 application topically 3 (three) times daily.   polyethylene glycol 17 g packet Commonly known as: MIRALAX / GLYCOLAX Take 17 g by mouth daily.   pregabalin 225 MG capsule Commonly known as: LYRICA Take 1 capsule (225 mg total) by mouth 2 (two) times daily.   ProAir RespiClick 497 (90 Base) MCG/ACT Aepb Generic drug: Albuterol Sulfate Inhale 2 puffs into the lungs every 6 (six) hours as needed.   simvastatin 20 MG tablet Commonly known as: ZOCOR Take 20 mg by mouth daily.   tadalafil 10 MG tablet Commonly known as: CIALIS Take 1 tablet (10 mg total) by mouth every other day as needed for erectile dysfunction.   tamsulosin 0.4 MG Caps capsule Commonly known as: FLOMAX Take 1 capsule (0.4 mg total) by mouth daily.   topiramate 100 MG tablet Commonly known as: TOPAMAX Take 1 tablet (100 mg total) by mouth 2 (two) times daily.   traZODone 150 MG tablet Commonly known as: DESYREL Take 150 mg by mouth at bedtime.   triamcinolone cream 0.1 % Commonly known as: KENALOG Apply 1 application topically 2 (two) times daily.   Trulicity 0.26 VZ/8.5YI Sopn Generic drug: Dulaglutide Inject 0.75 mg into the skin once a week. What changed: additional instructions   ursodiol 250 MG tablet Commonly known as: ACTIGALL Take 250 mg by mouth 2 (two) times daily.   Voltaren 1 % Gel Generic drug: diclofenac Sodium Apply 2 g topically  4 (four) times daily.        Follow-up Information     Elsie Stain, MD. Schedule an appointment as soon as possible for a visit in 1 week(s).   Specialty: Pulmonary Disease Contact information: [address] [address] [phone]                No Known Allergies  Consultations: None   Procedures/Studies: DG Chest 2 View  Result Date: 01/20/2021 CLINICAL DATA:  Chest pain. EXAM: CHEST - 2 VIEW COMPARISON:  None. FINDINGS: Low lung volumes. The cardio pericardial silhouette is enlarged. There is pulmonary vascular congestion without overt pulmonary edema. No focal airspace consolidation or pleural effusion. Telemetry leads overlie the chest. IMPRESSION: Low volume film with cardiomegaly and vascular congestion. Electronically Signed   By: Misty Stanley M.D.   On: 01/20/2021 14:33   CT Head Wo Contrast  Result Date: 01/20/2021 CLINICAL DATA:  Dizziness, hypertension EXAM: CT HEAD WITHOUT CONTRAST TECHNIQUE: Contiguous axial images were obtained from the base of the skull through the vertex without intravenous contrast. COMPARISON:  None. FINDINGS: Brain: No evidence of acute infarction, hemorrhage, hydrocephalus, extra-axial collection or mass lesion/mass effect. Vascular:  Atherosclerotic calcifications involving the large vessels of the skull base. No unexpected hyperdense vessel. Skull: Normal. Negative for fracture or focal lesion. Sinuses/Orbits: No acute finding. Other: Ill-defined soft tissue density within the suboccipital soft tissues seen only on axial images. This area measures approximately 2.7 x 2.2 cm (series 3, image 5) IMPRESSION: 1. No acute intracranial findings. 2. Ill-defined soft tissue density within the midline suboccipital soft tissues measuring approximately 2.7 x 2.2 cm. Findings are nonspecific and could represent a hematoma, scarring, or potentially a soft tissue mass. Correlate with physical exam. Any prior outside cross-sectional  imaging of the head or neck would be helpful to assess the stability of this finding. Electronically Signed   By: Davina Poke D.O.   On: 01/20/2021 15:51   CT Chest Wo Contrast  Result Date: 01/20/2021 CLINICAL DATA:  Flank pain, suspected kidney stone in a patient with hypotension and chest pain. Also with acute kidney injury in this 55 year old male. EXAM: CT CHEST, ABDOMEN AND PELVIS WITHOUT CONTRAST TECHNIQUE: Multidetector CT imaging of the chest, abdomen and pelvis was performed following the standard protocol without IV contrast. COMPARISON:  None aside from chest x-ray from January 20, 2021. FINDINGS: CT CHEST FINDINGS Cardiovascular: Normal caliber thoracic aorta with scattered atherosclerosis. Three-vessel coronary artery disease. Normal heart size without pericardial effusion. Normal appearance of central pulmonary vasculature in terms of caliber. Limited assessment of cardiovascular structures given lack of intravenous contrast. Mediastinum/Nodes: Esophagus mildly patulous. No mediastinal, thoracic inlet or axillary lymphadenopathy. No gross hilar lymphadenopathy. Lungs/Pleura: No signs of consolidation or evidence of pleural effusion. Airways are patent. Musculoskeletal: Spinal degenerative changes. No acute or destructive bone process. Visualized clavicles and scapulae as well sternum are unremarkable. CT ABDOMEN PELVIS FINDINGS Hepatobiliary: Cholelithiasis without signs of pericholecystic stranding. Signs of hepatic steatosis.  No contour abnormality of the liver. Pancreas: Normal contour without signs of adjacent inflammation. Spleen: Spleen normal size and contour. Adrenals/Urinary Tract: Normal adrenal glands. Perinephric stranding without hydronephrosis.  No ureteral calculi. Small low-density lesion arises from the lateral LEFT kidney approximately 14 mm. Measured density is not felt to be reliable but is in the 12 Hounsfield unit range. 3 cm lesion with low-density spherical contours  extending from cortex in the sinus fat in the lower pole. These lesions are referenced in a study from 2016, report available via care everywhere and the RIGHT renal lesion may have enlarged. Stomach/Bowel: Signs of sleeve gastrectomy. Patulous appearance of the stomach without signs of perigastric stranding. No signs of acute small bowel process. Normal appendix. Vascular/Lymphatic: Calcified atheromatous plaque of the abdominal aorta. Smooth contour of the IVC. There is no gastrohepatic or hepatoduodenal ligament lymphadenopathy. No retroperitoneal or mesenteric lymphadenopathy. Mild prominence of pelvic lymph nodes without frank pathologic enlargement. Some of these are top-normal size largest 1.3 cm, as large as 2.3 cm by report on previous imaging available also from 2016. Again no images for this study are available. These are likely reactive. Reproductive: Unremarkable Other: Fat containing umbilical hernia is moderately large. Some stranding over the RIGHT body wall, extending off the field of view. Collateral venous pathways in the body wall. Musculoskeletal: Spinal degenerative changes. No acute or destructive bone process. IMPRESSION: 1. No definite acute findings in the chest, abdomen or pelvis. 2. No evidence of urinary tract calculi or obstruction. Mild bilateral perinephric stranding is nonspecific. Correlate with urinalysis 3. Signs of hepatic steatosis. 4. Cholelithiasis without signs of pericholecystic stranding. 5. Stranding over the RIGHT body wall is incompletely imaged. Correlate with any  signs of cellulitis and or panniculitis. 6. Low-density renal lesions referenced on prior imaging reports, density of the lower pole lesion on the RIGHT is compatible with a cyst and is likely enlarged based on prior imaging reports. Density values are somewhat compromised by patient body habitus. 7. Signs of sleeve gastrectomy 8. Aortic atherosclerosis in coronary artery disease. Aortic Atherosclerosis  (ICD10-I70.0). Electronically Signed   By: Zetta Bills M.D.   On: 01/20/2021 19:38   CT Renal Stone Study  Result Date: 01/20/2021 CLINICAL DATA:  Flank pain, suspected kidney stone in a patient with hypotension and chest pain. Also with acute kidney injury in this 55 year old male. EXAM: CT CHEST, ABDOMEN AND PELVIS WITHOUT CONTRAST TECHNIQUE: Multidetector CT imaging of the chest, abdomen and pelvis was performed following the standard protocol without IV contrast. COMPARISON:  None aside from chest x-ray from January 20, 2021. FINDINGS: CT CHEST FINDINGS Cardiovascular: Normal caliber thoracic aorta with scattered atherosclerosis. Three-vessel coronary artery disease. Normal heart size without pericardial effusion. Normal appearance of central pulmonary vasculature in terms of caliber. Limited assessment of cardiovascular structures given lack of intravenous contrast. Mediastinum/Nodes: Esophagus mildly patulous. No mediastinal, thoracic inlet or axillary lymphadenopathy. No gross hilar lymphadenopathy. Lungs/Pleura: No signs of consolidation or evidence of pleural effusion. Airways are patent. Musculoskeletal: Spinal degenerative changes. No acute or destructive bone process. Visualized clavicles and scapulae as well sternum are unremarkable. CT ABDOMEN PELVIS FINDINGS Hepatobiliary: Cholelithiasis without signs of pericholecystic stranding. Signs of hepatic steatosis.  No contour abnormality of the liver. Pancreas: Normal contour without signs of adjacent inflammation. Spleen: Spleen normal size and contour. Adrenals/Urinary Tract: Normal adrenal glands. Perinephric stranding without hydronephrosis.  No ureteral calculi. Small low-density lesion arises from the lateral LEFT kidney approximately 14 mm. Measured density is not felt to be reliable but is in the 12 Hounsfield unit range. 3 cm lesion with low-density spherical contours extending from cortex in the sinus fat in the lower pole. These lesions are  referenced in a study from 2016, report available via care everywhere and the RIGHT renal lesion may have enlarged. Stomach/Bowel: Signs of sleeve gastrectomy. Patulous appearance of the stomach without signs of perigastric stranding. No signs of acute small bowel process. Normal appendix. Vascular/Lymphatic: Calcified atheromatous plaque of the abdominal aorta. Smooth contour of the IVC. There is no gastrohepatic or hepatoduodenal ligament lymphadenopathy. No retroperitoneal or mesenteric lymphadenopathy. Mild prominence of pelvic lymph nodes without frank pathologic enlargement. Some of these are top-normal size largest 1.3 cm, as large as 2.3 cm by report on previous imaging available also from 2016. Again no images for this study are available. These are likely reactive. Reproductive: Unremarkable Other: Fat containing umbilical hernia is moderately large. Some stranding over the RIGHT body wall, extending off the field of view. Collateral venous pathways in the body wall. Musculoskeletal: Spinal degenerative changes. No acute or destructive bone process. IMPRESSION: 1. No definite acute findings in the chest, abdomen or pelvis. 2. No evidence of urinary tract calculi or obstruction. Mild bilateral perinephric stranding is nonspecific. Correlate with urinalysis 3. Signs of hepatic steatosis. 4. Cholelithiasis without signs of pericholecystic stranding. 5. Stranding over the RIGHT body wall is incompletely imaged. Correlate with any signs of cellulitis and or panniculitis. 6. Low-density renal lesions referenced on prior imaging reports, density of the lower pole lesion on the RIGHT is compatible with a cyst and is likely enlarged based on prior imaging reports. Density values are somewhat compromised by patient body habitus. 7. Signs of sleeve gastrectomy 8.  Aortic atherosclerosis in coronary artery disease. Aortic Atherosclerosis (ICD10-I70.0). Electronically Signed   By: Zetta Bills M.D.   On: 01/20/2021  19:38      Subjective: Patient seen and examined the bedside this morning.  Hemodynamically stable for discharge.   Discharge Exam: Vitals:   01/21/21 2127 01/22/21 0505  BP: 127/72 133/89  Pulse: 71 72  Resp: 20   Temp: 97.6 F (36.4 C) 97.6 F (36.4 C)  SpO2: 99% 99%   Vitals:   01/21/21 0604 01/21/21 1604 01/21/21 2127 01/22/21 0505  BP: 98/86 (!) 101/58 127/72 133/89  Pulse: 75 64 71 72  Resp: 16 17 20    Temp: 97.7 F (36.5 C) 98.1 F (36.7 C) 97.6 F (36.4 C) 97.6 F (36.4 C)  TempSrc:   Oral   SpO2: 100% 98% 99% 99%  Weight:      Height:        General: Pt is alert, awake, not in acute distress,obese Cardiovascular: RRR, S1/S2 +, no rubs, no gallops Respiratory: CTA bilaterally, no wheezing, no rhonchi Abdominal: Soft, NT, ND, bowel sounds + Extremities: no edema, no cyanosis    The results of significant diagnostics from this hospitalization (including imaging, microbiology, ancillary and laboratory) are listed below for reference.     Microbiology: Recent Results (from the past 240 hour(s))  Blood culture (routine x 2)     Status: None (Preliminary result)   Collection Time: 01/20/21  1:42 PM   Specimen: BLOOD  Result Value Ref Range Status   Specimen Description BLOOD BLOOD LEFT HAND  Final   Special Requests   Final    BOTTLES DRAWN AEROBIC AND ANAEROBIC Blood Culture results may not be optimal due to an inadequate volume of blood received in culture bottles   Culture   Final    NO GROWTH 1 DAY Performed at [facility], [place] [address], [place], [place] [address]    Report Status PENDING  Incomplete  Blood culture (routine x 2)     Status: None (Preliminary result)   Collection Time: 01/20/21  1:47 PM   Specimen: BLOOD  Result Value Ref Range Status   Specimen Description BLOOD BLOOD LEFT FOREARM  Final   Special Requests   Final    BLOOD AEROBIC BOTTLE Blood Culture results may not be optimal due to an inadequate volume of blood  received in culture bottles   Culture   Final    NO GROWTH 1 DAY Performed at [facility], [place] [address] [place], [place] [address]    Report Status PENDING  Incomplete  Resp Panel by RT-PCR (Flu A&B, Covid) Nasopharyngeal Swab     Status: None   Collection Time: 01/20/21  3:51 PM   Specimen: Nasopharyngeal Swab; Nasopharyngeal(NP) swabs in vial transport medium  Result Value Ref Range Status   SARS Coronavirus 2 by RT PCR NEGATIVE NEGATIVE Final    Comment: (NOTE) SARS-CoV-2 target nucleic acids are NOT DETECTED.  The SARS-CoV-2 RNA is generally detectable in upper respiratory specimens during the acute phase of infection. The lowest concentration of SARS-CoV-2 viral copies this assay can detect is 138 copies/mL. A negative result does not preclude SARS-Cov-2 infection and should not be used as the sole basis for treatment or other patient management decisions. A negative result may occur with  improper specimen collection/handling, submission of specimen other than nasopharyngeal swab, presence of viral mutation(s) within the areas targeted by this assay, and inadequate number of viral copies(<138 copies/mL). A negative  result must be combined with clinical observations, patient history, and epidemiological information. The expected result is Negative.  Fact Sheet for Patients:  EntrepreneurPulse.com.au  Fact Sheet for Healthcare Providers:  IncredibleEmployment.be  This test is no t yet approved or cleared by the [place] FDA and  has been authorized for detection and/or diagnosis of SARS-CoV-2 by FDA under an Emergency Use Authorization (EUA). This EUA will remain  in effect (meaning this test can be used) for the duration of the COVID-19 declaration under Section 564(b)(1) of the Act, 21 U.S.C.section 360bbb-3(b)(1), unless the authorization is terminated  or revoked sooner.       Influenza A by PCR NEGATIVE NEGATIVE  Final   Influenza B by PCR NEGATIVE NEGATIVE Final    Comment: (NOTE) The Xpert Xpress SARS-CoV-2/FLU/RSV plus assay is intended as an aid in the diagnosis of influenza from Nasopharyngeal swab specimens and should not be used as a sole basis for treatment. Nasal washings and aspirates are unacceptable for Xpert Xpress SARS-CoV-2/FLU/RSV testing.  Fact Sheet for Patients: EntrepreneurPulse.com.au  Fact Sheet for Healthcare Providers: IncredibleEmployment.be  This test is not yet approved or cleared by the [place] FDA and has been authorized for detection and/or diagnosis of SARS-CoV-2 by FDA under an Emergency Use Authorization (EUA). This EUA will remain in effect (meaning this test can be used) for the duration of the COVID-19 declaration under Section 564(b)(1) of the Act, 21 U.S.C. section 360bbb-3(b)(1), unless the authorization is terminated or revoked.  Performed at [facility], [place] [address], [place], [place] [address]      Labs: BNP (last 3 results) No results for input(s): BNP in the last 8760 hours. Basic Metabolic Panel: Recent Labs  Lab 01/16/21 1447 01/20/21 1614 01/21/21 0204 01/22/21 0322  NA 135 133* 135 136  K 5.0 4.8 4.5 4.5  CL 101 107 108 108  CO2 22 22 23 25   GLUCOSE 318* 264* 207* 223*  BUN 20 35* 28* 20  CREATININE 1.03 1.84* 1.34* 1.19  CALCIUM 9.6 8.7* 9.0 9.0   Liver Function Tests: Recent Labs  Lab 01/16/21 1447 01/20/21 1614 01/21/21 0204  AST 33 44* 36  ALT 78* 70* 68*  ALKPHOS 79 49 52  BILITOT 0.3 0.7 0.8  PROT 7.4 6.1* 6.1*  ALBUMIN 3.9 2.9* 2.8*   No results for input(s): LIPASE, AMYLASE in the last 168 hours. No results for input(s): AMMONIA in the last 168 hours. CBC: Recent Labs  Lab 01/20/21 1336 01/21/21 0204  WBC 6.9 6.6  HGB 12.6* 11.5*  HCT 37.8* 33.6*  MCV 102.4* 100.9*  PLT 255 240   Cardiac Enzymes: No results for input(s): CKTOTAL, CKMB, CKMBINDEX,  TROPONINI in the last 168 hours. BNP: Invalid input(s): POCBNP CBG: Recent Labs  Lab 01/21/21 0744 01/21/21 1205 01/21/21 1603 01/21/21 2118 01/22/21 0821  GLUCAP 277* 184* 166* 179* 199*   D-Dimer No results for input(s): DDIMER in the last 72 hours. Hgb A1c No results for input(s): HGBA1C in the last 72 hours. Lipid Profile No results for input(s): CHOL, HDL, LDLCALC, TRIG, CHOLHDL, LDLDIRECT in the last 72 hours. Thyroid function studies No results for input(s): TSH, T4TOTAL, T3FREE, THYROIDAB in the last 72 hours.  Invalid input(s): FREET3 Anemia work up No results for input(s): VITAMINB12, FOLATE, FERRITIN, TIBC, IRON, RETICCTPCT in the last 72 hours. Urinalysis    Component Value Date/Time   COLORURINE YELLOW 01/20/2021 1844   APPEARANCEUR HAZY (A) 01/20/2021 1844   LABSPEC 1.019 01/20/2021 1844  PHURINE 5.0 01/20/2021 1844   GLUCOSEU NEGATIVE 01/20/2021 1844   HGBUR NEGATIVE 01/20/2021 1844   BILIRUBINUR NEGATIVE 01/20/2021 1844   KETONESUR NEGATIVE 01/20/2021 1844   PROTEINUR NEGATIVE 01/20/2021 1844   NITRITE NEGATIVE 01/20/2021 1844   LEUKOCYTESUR NEGATIVE 01/20/2021 1844   Sepsis Labs Invalid input(s): PROCALCITONIN,  WBC,  LACTICIDVEN Microbiology Recent Results (from the past 240 hour(s))  Blood culture (routine x 2)     Status: None (Preliminary result)   Collection Time: 01/20/21  1:42 PM   Specimen: BLOOD  Result Value Ref Range Status   Specimen Description BLOOD BLOOD LEFT HAND  Final   Special Requests   Final    BOTTLES DRAWN AEROBIC AND ANAEROBIC Blood Culture results may not be optimal due to an inadequate volume of blood received in culture bottles   Culture   Final    NO GROWTH 1 DAY Performed at [facility], [place] [address], [place], [place] [address]    Report Status PENDING  Incomplete  Blood culture (routine x 2)     Status: None (Preliminary result)   Collection Time: 01/20/21  1:47 PM   Specimen: BLOOD  Result Value Ref  Range Status   Specimen Description BLOOD BLOOD LEFT FOREARM  Final   Special Requests   Final    BLOOD AEROBIC BOTTLE Blood Culture results may not be optimal due to an inadequate volume of blood received in culture bottles   Culture   Final    NO GROWTH 1 DAY Performed at [facility], [place] [address], [place], [place] [address]    Report Status PENDING  Incomplete  Resp Panel by RT-PCR (Flu A&B, Covid) Nasopharyngeal Swab     Status: None   Collection Time: 01/20/21  3:51 PM   Specimen: Nasopharyngeal Swab; Nasopharyngeal(NP) swabs in vial transport medium  Result Value Ref Range Status   SARS Coronavirus 2 by RT PCR NEGATIVE NEGATIVE Final    Comment: (NOTE) SARS-CoV-2 target nucleic acids are NOT DETECTED.  The SARS-CoV-2 RNA is generally detectable in upper respiratory specimens during the acute phase of infection. The lowest concentration of SARS-CoV-2 viral copies this assay can detect is 138 copies/mL. A negative result does not preclude SARS-Cov-2 infection and should not be used as the sole basis for treatment or other patient management decisions. A negative result may occur with  improper specimen collection/handling, submission of specimen other than nasopharyngeal swab, presence of viral mutation(s) within the areas targeted by this assay, and inadequate number of viral copies(<138 copies/mL). A negative result must be combined with clinical observations, patient history, and epidemiological information. The expected result is Negative.  Fact Sheet for Patients:  EntrepreneurPulse.com.au  Fact Sheet for Healthcare Providers:  IncredibleEmployment.be  This test is no t yet approved or cleared by the [place] FDA and  has been authorized for detection and/or diagnosis of SARS-CoV-2 by FDA under an Emergency Use Authorization (EUA). This EUA will remain  in effect (meaning this test can be used) for the duration of  the COVID-19 declaration under Section 564(b)(1) of the Act, 21 U.S.C.section 360bbb-3(b)(1), unless the authorization is terminated  or revoked sooner.       Influenza A by PCR NEGATIVE NEGATIVE Final   Influenza B by PCR NEGATIVE NEGATIVE Final    Comment: (NOTE) The Xpert Xpress SARS-CoV-2/FLU/RSV plus assay is intended as an aid in the diagnosis of influenza from Nasopharyngeal swab specimens and should not be used as a sole basis for  treatment. Nasal washings and aspirates are unacceptable for Xpert Xpress SARS-CoV-2/FLU/RSV testing.  Fact Sheet for Patients: EntrepreneurPulse.com.au  Fact Sheet for Healthcare Providers: IncredibleEmployment.be  This test is not yet approved or cleared by the [place] FDA and has been authorized for detection and/or diagnosis of SARS-CoV-2 by FDA under an Emergency Use Authorization (EUA). This EUA will remain in effect (meaning this test can be used) for the duration of the COVID-19 declaration under Section 564(b)(1) of the Act, 21 U.S.C. section 360bbb-3(b)(1), unless the authorization is terminated or revoked.  Performed at [facility], [place] [address], [place], [place] [address]     Please note: You were cared for by a hospitalist during your hospital stay. Once you are discharged, your primary care physician will handle any further medical issues. Please note that NO REFILLS for any discharge medications will be authorized once you are discharged, as it is imperative that you return to your primary care physician (or establish a relationship with a primary care physician if you do not have one) for your post hospital discharge needs so that they can reassess your need for medications and monitor your lab values.    Time coordinating discharge: 40 minutes  SIGNED:   Shelly Coss, MD  [facility] 01/22/2021, 11:48 AM Pager [phone]  If 7PM-7AM, please contact  night-coverage [email or web address] Password [email or web address]

## 2021-01-22 NOTE — Evaluation (Signed)
Occupational Therapy Evaluation Patient Details Name: Jeremy Bauer MRN: [id number] DOB: May 11, 1966 Today's Date: 01/22/2021    History of Present Illness The pt is a 55 yo male presenting 7/15 with c/o blurred vision and hypotension. PMH includes: morbid obesity s/p laparoscopic sleeve gastrectomy, DM II, polyneuropathy, BPH, HTN, OSA, and polysubstance abuse.   Clinical Impression   PTA patient was living at the [facility] and was grossly Mod I with ADLs/IADLs without AD. Patient currently functioning near baseline demonstrating observed ADLs including ADL transfers and 3/3 parts of toileting task and UB/LB bathing/dressing at sink level with supervision A for safety and heavy UE support on sink surface. Patient also limited by deficits listed below including decreased activity tolerance, mild balance deficits and decreased cardiopulmonary endurance and would benefit from continued acute OT services in prep for safe d/c home. Patient with good recall of energy conservation techniques including taking rest breaks as needed and is likely to progress well. OT will continue to follow acutely.      Follow Up Recommendations  No OT follow up    Equipment Recommendations  Other (comment) (Patient interested in rollator)    Recommendations for Other Services       Precautions / Restrictions Precautions Precautions: Fall Restrictions Weight Bearing Restrictions: No      Mobility Bed Mobility               General bed mobility comments: Patient seated in recliner upon entry.    Transfers Overall transfer level: Needs assistance Equipment used: None Transfers: Sit to/from Stand Sit to Stand: Supervision         General transfer comment: Supervision A from low recliner and standard commode in bathroom.    Balance Overall balance assessment: Mild deficits observed, not formally tested                                         ADL either performed or  assessed with clinical judgement   ADL Overall ADL's : Needs assistance/impaired                         Toilet Transfer: Radiographer, therapeutic Details (indicate cue type and reason): Supervision A without AD; uses grab bar for sit <> stand from low commode in bathroom Toileting- Clothing Manipulation and Hygiene: Supervision/safety Toileting - Clothing Manipulation Details (indicate cue type and reason): Supervision A for safety.     Functional mobility during ADLs: Supervision/safety General ADL Comments: Patient limited by generalized weakness, large body habitus and mild balance deficits.     Vision Baseline Vision/History: Wears glasses Wears Glasses: At all times Patient Visual Report: No change from baseline       Perception     Praxis      Pertinent Vitals/Pain Pain Assessment: No/denies pain     Hand Dominance Right   Extremity/Trunk Assessment Upper Extremity Assessment Upper Extremity Assessment: Overall WFL for tasks assessed   Lower Extremity Assessment Lower Extremity Assessment: Generalized weakness   Cervical / Trunk Assessment Cervical / Trunk Assessment: Other exceptions Cervical / Trunk Exceptions: large body habitus   Communication Communication Communication: No difficulties   Cognition Arousal/Alertness: Awake/alert Behavior During Therapy: WFL for tasks assessed/performed Overall Cognitive Status: Within Functional Limits for tasks assessed  General Comments: pt tangential with decreased insight to deficits. poor medical understanding, but likely baseline   General Comments  VSS on RA.    Exercises     Shoulder Instructions      Home Living Family/patient expects to be discharged to:: [facility])   Available Help at Discharge: Available PRN/intermittently;Friend(s)   Home Access: Level entry     Home Layout: Multi-level Alternate Level  Stairs-Number of Steps: Consulting civil engineer Shower/Tub: Producer, television/film/video: Handicapped height     Home Equipment: Shower seat;Grab bars - toilet;Grab bars - tub/shower          Prior Functioning/Environment Level of Independence: Independent        Comments: pt independent, no longer has access to DME. limited in ambulation distance due to fagitue. [facility] provides meals        OT Problem List: Decreased strength      OT Treatment/Interventions: Self-care/ADL training;Therapeutic exercise;DME and/or AE instruction;Patient/family education;Balance training    OT Goals(Current goals can be found in the care plan section) Acute Rehab OT Goals Patient Stated Goal: start aquatic exercise program OT Goal Formulation: With patient Time For Goal Achievement: 02/05/21 Potential to Achieve Goals: Good ADL Goals Additional ADL Goal #1: Patient will complete ADLs with Mod I and AD/AE PRN in prep for safe return to PLOF. Additional ADL Goal #2: Patient will recall 3 energy conservation techniques in prep for ADLs/IADLs.  OT Frequency: Min 2X/week   Barriers to D/C:            Co-evaluation              AM-PAC OT "6 Clicks" Daily Activity     Outcome Measure Help from another person eating meals?: None Help from another person taking care of personal grooming?: A Little Help from another person toileting, which includes using toliet, bedpan, or urinal?: A Little Help from another person bathing (including washing, rinsing, drying)?: A Little Help from another person to put on and taking off regular upper body clothing?: A Little Help from another person to put on and taking off regular lower body clothing?: A Little 6 Click Score: 19   End of Session    Activity Tolerance: Patient tolerated treatment well Patient left: in chair;with call bell/phone within reach  OT Visit Diagnosis: Muscle weakness (generalized) (M62.81)                Time:  1010-1035 OT Time Calculation (min): 25 min Charges:  OT General Charges $OT Visit: 1 Visit OT Evaluation $OT Eval Low Complexity: 1 Low OT Treatments $Self Care/Home Management : 8-22 mins  Karita Dralle H. OTR/L Supplemental OT, Department of rehab services [phone]  Bisma Klett R H. 01/22/2021, 10:31 AM

## 2021-01-23 ENCOUNTER — Telehealth: Payer: Self-pay

## 2021-01-23 ENCOUNTER — Other Ambulatory Visit: Payer: Self-pay

## 2021-01-23 NOTE — Telephone Encounter (Signed)
Call returned to patient # [phone], message left with call back requested to this CM.

## 2021-01-23 NOTE — Telephone Encounter (Signed)
Transition Care Management Unsuccessful Follow-up Telephone Call  Date of discharge and from where:  01/22/2021, [facility]   Attempts:  1st Attempt  Reason for unsuccessful TCM follow-up call:  Left voice message on # [phone]. Call back requested to this CM.  Call initially placed to number on file # [phone] and the message on that phone stated to call # [phone].   Patient will follow up with [name] at [facility]

## 2021-01-23 NOTE — Telephone Encounter (Signed)
Pt returned call to office, please advise. Pt wants office to bring his prescriptions when they come to see him on Wednesday, the Rxs are ready at the hospital he says.

## 2021-01-24 ENCOUNTER — Other Ambulatory Visit: Payer: Self-pay

## 2021-01-24 ENCOUNTER — Telehealth: Payer: Self-pay

## 2021-01-24 NOTE — Telephone Encounter (Signed)
Transition Care Management Unsuccessful Follow-up Telephone Call  Date of discharge and from where: [facility]  on 01/22/2021  Attempts:  1st Attempt  Reason for unsuccessful TCM follow-up call:  Unable to leave message, Voice message full. Send SMS notification.   Pt have appt with Dr Lelon Perla on 03/20/2021. Will follow up with patient for a possible earlier appt.

## 2021-01-25 ENCOUNTER — Encounter: Payer: Self-pay | Admitting: Critical Care Medicine

## 2021-01-25 ENCOUNTER — Telehealth: Payer: Self-pay

## 2021-01-25 ENCOUNTER — Other Ambulatory Visit: Payer: Self-pay | Admitting: Critical Care Medicine

## 2021-01-25 ENCOUNTER — Other Ambulatory Visit: Payer: Self-pay

## 2021-01-25 DIAGNOSIS — E1165 Type 2 diabetes mellitus with hyperglycemia: Secondary | ICD-10-CM

## 2021-01-25 LAB — CULTURE, BLOOD (ROUTINE X 2)
Culture: NO GROWTH
Culture: NO GROWTH

## 2021-01-25 NOTE — Telephone Encounter (Signed)
Transition Care Management Follow-up Telephone Call Date of discharge and from where: 01/22/2021, [facility]  How have you been since you were released from the hospital?   The patient answered and said the he has not seen the nurse at [facility] yet and is in need of his medications. Informed him that [name] will be seeing him this afternoon.  The call then dropped.  Call placed again to # [phone], and the message stated to call # [phone]. Call placed to that number and message left with call back requested to this CM.

## 2021-01-26 NOTE — Progress Notes (Signed)
This patient is seen today in the [place] shelter clinic and as a post hospital follow-up.  I previously seen the [facility] but then on July 15 he was admitted with a syncopal episode and blood pressure 60/40 with volume depletion and acute kidney injury.  Patient has history of morbid obesity type 2 diabetes hypertension and severe neuropathy of the lower extremities.  We did a complete medication reconciliation post hospital with this patient at this visit today overall he is improving.  He is holding his furosemide.  He is staying hydrated.  Note his last set of labs before he was discharged on 17 July showed improved creatinine.  Patient does need lab follow-up.  On exam blood pressure 146/84 pulse 88 saturation is 94% room air blood glucose was 174  Chest and heart exam are unremarkable lower extremities show no cellulitis no edema  Plan is for the patient to continue to hold furosemide other medications were reconciled we did bring him his new prescription for glipizide he is to continue Trulicity and metformin as prescribed  Patient was given a prescription for cialis but asked to stay off this until he stabilizes further he does need to go to a drug rehab facility we need to get him cleared medically before this can occur  Patient will come to my office to receive lab draw the following Monday

## 2021-01-30 ENCOUNTER — Encounter: Payer: Self-pay | Admitting: Critical Care Medicine

## 2021-01-30 ENCOUNTER — Other Ambulatory Visit: Payer: Self-pay

## 2021-01-30 ENCOUNTER — Telehealth: Payer: Self-pay | Admitting: Critical Care Medicine

## 2021-01-30 ENCOUNTER — Ambulatory Visit: Payer: Medicaid Other | Attending: Critical Care Medicine

## 2021-01-30 DIAGNOSIS — E1165 Type 2 diabetes mellitus with hyperglycemia: Secondary | ICD-10-CM

## 2021-01-30 NOTE — Telephone Encounter (Signed)
Let Tyra know I prepared the lettre and I will bring it with me Wednesday

## 2021-01-30 NOTE — Telephone Encounter (Signed)
-----   Message from Rocco Pauls, RN sent at 01/26/2021  3:43 PM EDT ----- Cleone Slim Tyra ask about paying for mr Gellatly water classes. Did I understand you correctly when you told him to start slow with walking in the pool and build up strength by gradually walking in the pool with weights and then moving to a water aerobics class? Tyra wanted to know if you could write a letter stating the plan? Thanks, helen

## 2021-01-31 LAB — COMPREHENSIVE METABOLIC PANEL
ALT: 81 IU/L — ABNORMAL HIGH (ref 0–44)
AST: 42 IU/L — ABNORMAL HIGH (ref 0–40)
Albumin/Globulin Ratio: 1.3 (ref 1.2–2.2)
Albumin: 4 g/dL (ref 3.8–4.9)
Alkaline Phosphatase: 69 IU/L (ref 44–121)
BUN/Creatinine Ratio: 20 (ref 9–20)
BUN: 24 mg/dL (ref 6–24)
Bilirubin Total: 0.7 mg/dL (ref 0.0–1.2)
CO2: 20 mmol/L (ref 20–29)
Calcium: 9.8 mg/dL (ref 8.7–10.2)
Chloride: 105 mmol/L (ref 96–106)
Creatinine, Ser: 1.23 mg/dL (ref 0.76–1.27)
Globulin, Total: 3.2 g/dL (ref 1.5–4.5)
Glucose: 148 mg/dL — ABNORMAL HIGH (ref 65–99)
Potassium: 5.1 mmol/L (ref 3.5–5.2)
Sodium: 140 mmol/L (ref 134–144)
Total Protein: 7.2 g/dL (ref 6.0–8.5)
eGFR: 69 mL/min/{1.73_m2} (ref >59)

## 2021-02-02 ENCOUNTER — Inpatient Hospital Stay (HOSPITAL_COMMUNITY)
Admission: EM | Admit: 2021-02-02 | Discharge: 2021-02-08 | DRG: 074 | Disposition: A | Discharge status: Home / Self Care | Payer: Medicaid Other | Attending: Internal Medicine | Admitting: Internal Medicine

## 2021-02-02 ENCOUNTER — Emergency Department (HOSPITAL_COMMUNITY): Payer: Medicaid Other

## 2021-02-02 ENCOUNTER — Telehealth: Payer: Self-pay | Admitting: *Deleted

## 2021-02-02 ENCOUNTER — Other Ambulatory Visit: Payer: Self-pay

## 2021-02-02 ENCOUNTER — Encounter (HOSPITAL_COMMUNITY): Payer: Self-pay | Admitting: Emergency Medicine

## 2021-02-02 DIAGNOSIS — Z794 Long term (current) use of insulin: Secondary | ICD-10-CM

## 2021-02-02 DIAGNOSIS — E119 Type 2 diabetes mellitus without complications: Secondary | ICD-10-CM

## 2021-02-02 DIAGNOSIS — R262 Difficulty in walking, not elsewhere classified: Secondary | ICD-10-CM | POA: Diagnosis present

## 2021-02-02 DIAGNOSIS — E1169 Type 2 diabetes mellitus with other specified complication: Secondary | ICD-10-CM | POA: Diagnosis present

## 2021-02-02 DIAGNOSIS — Z9119 Patient's noncompliance with other medical treatment and regimen: Secondary | ICD-10-CM

## 2021-02-02 DIAGNOSIS — Z8673 Personal history of transient ischemic attack (TIA), and cerebral infarction without residual deficits: Secondary | ICD-10-CM

## 2021-02-02 DIAGNOSIS — F1410 Cocaine abuse, uncomplicated: Secondary | ICD-10-CM | POA: Diagnosis present

## 2021-02-02 DIAGNOSIS — N400 Enlarged prostate without lower urinary tract symptoms: Secondary | ICD-10-CM | POA: Diagnosis present

## 2021-02-02 DIAGNOSIS — E1165 Type 2 diabetes mellitus with hyperglycemia: Secondary | ICD-10-CM | POA: Diagnosis present

## 2021-02-02 DIAGNOSIS — I10 Essential (primary) hypertension: Secondary | ICD-10-CM | POA: Diagnosis present

## 2021-02-02 DIAGNOSIS — N179 Acute kidney failure, unspecified: Secondary | ICD-10-CM | POA: Diagnosis present

## 2021-02-02 DIAGNOSIS — Z6841 Body Mass Index (BMI) 40.0 and over, adult: Secondary | ICD-10-CM

## 2021-02-02 DIAGNOSIS — Z791 Long term (current) use of non-steroidal anti-inflammatories (NSAID): Secondary | ICD-10-CM

## 2021-02-02 DIAGNOSIS — F1010 Alcohol abuse, uncomplicated: Secondary | ICD-10-CM

## 2021-02-02 DIAGNOSIS — M47816 Spondylosis without myelopathy or radiculopathy, lumbar region: Secondary | ICD-10-CM | POA: Diagnosis present

## 2021-02-02 DIAGNOSIS — F419 Anxiety disorder, unspecified: Secondary | ICD-10-CM | POA: Diagnosis present

## 2021-02-02 DIAGNOSIS — E559 Vitamin D deficiency, unspecified: Secondary | ICD-10-CM | POA: Diagnosis present

## 2021-02-02 DIAGNOSIS — E1142 Type 2 diabetes mellitus with diabetic polyneuropathy: Principal | ICD-10-CM | POA: Diagnosis present

## 2021-02-02 DIAGNOSIS — Z5901 Sheltered homelessness: Secondary | ICD-10-CM

## 2021-02-02 DIAGNOSIS — N529 Male erectile dysfunction, unspecified: Secondary | ICD-10-CM | POA: Diagnosis present

## 2021-02-02 DIAGNOSIS — M545 Low back pain, unspecified: Secondary | ICD-10-CM

## 2021-02-02 DIAGNOSIS — G4733 Obstructive sleep apnea (adult) (pediatric): Secondary | ICD-10-CM | POA: Diagnosis present

## 2021-02-02 DIAGNOSIS — M5136 Other intervertebral disc degeneration, lumbar region: Secondary | ICD-10-CM | POA: Diagnosis present

## 2021-02-02 DIAGNOSIS — Z7409 Other reduced mobility: Secondary | ICD-10-CM

## 2021-02-02 DIAGNOSIS — I951 Orthostatic hypotension: Secondary | ICD-10-CM

## 2021-02-02 DIAGNOSIS — Z79899 Other long term (current) drug therapy: Secondary | ICD-10-CM

## 2021-02-02 DIAGNOSIS — R292 Abnormal reflex: Secondary | ICD-10-CM | POA: Diagnosis present

## 2021-02-02 DIAGNOSIS — Z9884 Bariatric surgery status: Secondary | ICD-10-CM

## 2021-02-02 DIAGNOSIS — R32 Unspecified urinary incontinence: Secondary | ICD-10-CM | POA: Diagnosis present

## 2021-02-02 DIAGNOSIS — R29898 Other symptoms and signs involving the musculoskeletal system: Secondary | ICD-10-CM | POA: Diagnosis present

## 2021-02-02 DIAGNOSIS — E785 Hyperlipidemia, unspecified: Secondary | ICD-10-CM | POA: Diagnosis present

## 2021-02-02 DIAGNOSIS — Z20822 Contact with and (suspected) exposure to covid-19: Secondary | ICD-10-CM | POA: Diagnosis present

## 2021-02-02 DIAGNOSIS — N521 Erectile dysfunction due to diseases classified elsewhere: Secondary | ICD-10-CM | POA: Diagnosis present

## 2021-02-02 LAB — CBC WITH DIFFERENTIAL/PLATELET
Abs Immature Granulocytes: 0.03 10*3/uL (ref 0.00–0.07)
Basophils Absolute: 0.1 10*3/uL (ref 0.0–0.1)
Basophils Relative: 1 %
Eosinophils Absolute: 0.5 10*3/uL (ref 0.0–0.5)
Eosinophils Relative: 7 %
HCT: 35 % — ABNORMAL LOW (ref 39.0–52.0)
Hemoglobin: 11.4 g/dL — ABNORMAL LOW (ref 13.0–17.0)
Immature Granulocytes: 0 %
Lymphocytes Relative: 32 %
Lymphs Abs: 2.5 10*3/uL (ref 0.7–4.0)
MCH: 33.3 pg (ref 26.0–34.0)
MCHC: 32.6 g/dL (ref 30.0–36.0)
MCV: 102.3 fL — ABNORMAL HIGH (ref 80.0–100.0)
Monocytes Absolute: 0.9 10*3/uL (ref 0.1–1.0)
Monocytes Relative: 12 %
Neutro Abs: 3.8 10*3/uL (ref 1.7–7.7)
Neutrophils Relative %: 48 %
Platelets: 334 10*3/uL (ref 150–400)
RBC: 3.42 MIL/uL — ABNORMAL LOW (ref 4.22–5.81)
RDW: 11.9 % (ref 11.5–15.5)
WBC: 7.8 10*3/uL (ref 4.0–10.5)
nRBC: 0 % (ref 0.0–0.2)

## 2021-02-02 LAB — COMPREHENSIVE METABOLIC PANEL
ALT: 75 U/L — ABNORMAL HIGH (ref 0–44)
AST: 40 U/L (ref 15–41)
Albumin: 3.4 g/dL — ABNORMAL LOW (ref 3.5–5.0)
Alkaline Phosphatase: 52 U/L (ref 38–126)
Anion gap: 10 (ref 5–15)
BUN: 12 mg/dL (ref 6–20)
CO2: 19 mmol/L — ABNORMAL LOW (ref 22–32)
Calcium: 9 mg/dL (ref 8.9–10.3)
Chloride: 104 mmol/L (ref 98–111)
Creatinine, Ser: 1.12 mg/dL (ref 0.61–1.24)
GFR, Estimated: >60 mL/min (ref >60)
Glucose, Bld: 176 mg/dL — ABNORMAL HIGH (ref 70–99)
Potassium: 4.2 mmol/L (ref 3.5–5.1)
Sodium: 133 mmol/L — ABNORMAL LOW (ref 135–145)
Total Bilirubin: 0.5 mg/dL (ref 0.3–1.2)
Total Protein: 7 g/dL (ref 6.5–8.1)

## 2021-02-02 NOTE — ED Provider Notes (Signed)
Emergency Medicine Provider Triage Evaluation Note  Jeremy Bauer , a 55 y.o. male  was evaluated in triage.  Pt complains of weakness in his lower legs, also feels dizzy, started last night around 9 PM, came on suddenly, dizziness is only when he goes from a sitting to a standing position, states he feels as if the room is spinning, denies change in vision, paresthesias or weakness the upper and/or lower extremities, denies recent head trauma, not on anticoagulant.  Review of Systems  Positive: Leg weakness, dizziness Negative: Chest pain, shortness of breath  Physical Exam  BP 139/70 (BP Location: Left Arm)   Pulse 91   Temp 98.4 F (36.9 C) (Oral)   Resp 20   SpO2 96%  Gen:   Awake, no distress   Resp:  Normal effort  MSK:   Moves extremities without difficulty  Other:  No facial asymmetry, no difficulty with word finding, no slurring of his words, able to follow commands, no unilateral weakness.  Medical Decision Making  Medically screening exam initiated at 5:31 PM.  Appropriate orders placed.  Jeremy Bauer was informed that the remainder of the evaluation will be completed by another provider, this initial triage assessment does not replace that evaluation, and the importance of remaining in the ED until their evaluation is complete.  Presents with leg weakness and dizziness patient need further work-up.   Carroll Sage, PA-C 02/02/21 1732    Mancel Bale, MD 02/03/21 1357

## 2021-02-02 NOTE — Telephone Encounter (Signed)
Patient calls from [facility] ED. Until the last few nights he has been sleeping at the [facility]. Stated he has been confused and not in good condition the last 2 days including falls and urinating on himself. [name] is inquiring if he can be admitted to a nursing home facility until he can manage his medications and his care. I spoke with Freida Busman at [facility] which I relayed the information obtained to [name] regarding does he have a place at the shelter tonight if he is released from the hospital. I informed [name] he needed to be seen by the ED providers and to have ED discharge papers in order to stay at the shelter tonight. Mr. Freida Busman has agreed to take clothes and [name] walker to him at the ED at 11:00pm tonight. Explained to [name] to talk with the ED physician regarding his medication needs, his self care ability and the possibility of nursing home placement. At the end of our call he was being triaged by the nurse.

## 2021-02-02 NOTE — ED Triage Notes (Signed)
Pt here with multiple complaints, reports last night he developed bilateral leg weakness, severe lower back pain, and feeling disoriented at times. Pt A&O x 4 at this time.

## 2021-02-03 ENCOUNTER — Encounter (HOSPITAL_COMMUNITY): Payer: Self-pay | Admitting: Emergency Medicine

## 2021-02-03 ENCOUNTER — Other Ambulatory Visit: Payer: Self-pay

## 2021-02-03 ENCOUNTER — Emergency Department (HOSPITAL_COMMUNITY): Payer: Medicaid Other

## 2021-02-03 DIAGNOSIS — F1410 Cocaine abuse, uncomplicated: Secondary | ICD-10-CM | POA: Diagnosis not present

## 2021-02-03 DIAGNOSIS — R29898 Other symptoms and signs involving the musculoskeletal system: Secondary | ICD-10-CM | POA: Diagnosis not present

## 2021-02-03 DIAGNOSIS — Z7409 Other reduced mobility: Secondary | ICD-10-CM

## 2021-02-03 DIAGNOSIS — R262 Difficulty in walking, not elsewhere classified: Secondary | ICD-10-CM | POA: Diagnosis present

## 2021-02-03 LAB — URINALYSIS, ROUTINE W REFLEX MICROSCOPIC
Bilirubin Urine: NEGATIVE
Glucose, UA: NEGATIVE mg/dL
Hgb urine dipstick: NEGATIVE
Ketones, ur: NEGATIVE mg/dL
Leukocytes,Ua: NEGATIVE
Nitrite: NEGATIVE
Protein, ur: NEGATIVE mg/dL
Specific Gravity, Urine: 1.021 (ref 1.005–1.030)
pH: 5 (ref 5.0–8.0)

## 2021-02-03 LAB — GLUCOSE, CAPILLARY
Glucose-Capillary: 162 mg/dL — ABNORMAL HIGH (ref 70–99)
Glucose-Capillary: 89 mg/dL (ref 70–99)

## 2021-02-03 LAB — RESP PANEL BY RT-PCR (FLU A&B, COVID) ARPGX2
Influenza A by PCR: NEGATIVE
Influenza B by PCR: NEGATIVE
SARS Coronavirus 2 by RT PCR: NEGATIVE

## 2021-02-03 LAB — VITAMIN D 25 HYDROXY (VIT D DEFICIENCY, FRACTURES): Vit D, 25-Hydroxy: 29.19 ng/mL — ABNORMAL LOW (ref 30–100)

## 2021-02-03 LAB — RAPID URINE DRUG SCREEN, HOSP PERFORMED
Amphetamines: NOT DETECTED
Barbiturates: NOT DETECTED
Benzodiazepines: NOT DETECTED
Cocaine: POSITIVE — AB
Opiates: NOT DETECTED
Tetrahydrocannabinol: NOT DETECTED

## 2021-02-03 LAB — TSH: TSH: 1.231 u[IU]/mL (ref 0.350–4.500)

## 2021-02-03 LAB — VITAMIN B12: Vitamin B-12: 1087 pg/mL — ABNORMAL HIGH (ref 180–914)

## 2021-02-03 LAB — HEMOGLOBIN A1C
Hgb A1c MFr Bld: 7.5 % — ABNORMAL HIGH (ref 4.8–5.6)
Mean Plasma Glucose: 168.55 mg/dL

## 2021-02-03 LAB — MAGNESIUM: Magnesium: 1.7 mg/dL (ref 1.7–2.4)

## 2021-02-03 LAB — FOLATE: Folate: 31.5 ng/mL (ref >5.9)

## 2021-02-03 MED ORDER — ACETAMINOPHEN 325 MG PO TABS: 650.0000 mg | ORAL_TABLET | Freq: Four times a day (QID) | ORAL | Status: DC | PRN

## 2021-02-03 MED ORDER — ALBUTEROL SULFATE 108 (90 BASE) MCG/ACT IN AEPB: 2.0000 | INHALATION_SPRAY | Freq: Four times a day (QID) | RESPIRATORY_TRACT | Status: DC | PRN

## 2021-02-03 MED ORDER — THIAMINE HCL 100 MG/ML IJ SOLN: 100.0000 mg | Freq: Every day | INTRAMUSCULAR | Status: DC

## 2021-02-03 MED ORDER — THIAMINE HCL 100 MG/ML IJ SOLN
500.0000 mg | Freq: Three times a day (TID) | INTRAVENOUS | Status: AC
  Administered 2021-02-03 – 2021-02-05 (×5): 500 mg via INTRAVENOUS
  Filled 2021-02-03 (×2): qty 5
  Filled 2021-02-03 (×2): qty 4
  Filled 2021-02-03 (×2): qty 5

## 2021-02-03 MED ORDER — GLIPIZIDE 5 MG PO TABS
5.0000 mg | ORAL_TABLET | Freq: Two times a day (BID) | ORAL | Status: DC
  Administered 2021-02-03 – 2021-02-04 (×2): 5 mg via ORAL
  Filled 2021-02-03 (×2): qty 1

## 2021-02-03 MED ORDER — TRAZODONE HCL 50 MG PO TABS
150.0000 mg | ORAL_TABLET | Freq: Every day | ORAL | Status: DC
  Administered 2021-02-03 – 2021-02-07 (×5): 150 mg via ORAL
  Filled 2021-02-03 (×5): qty 1

## 2021-02-03 MED ORDER — SODIUM CHLORIDE 0.9 % IV SOLN
INTRAVENOUS | Status: DC | PRN
  Administered 2021-02-03 – 2021-02-04 (×2): 250 mL via INTRAVENOUS

## 2021-02-03 MED ORDER — TOPIRAMATE 100 MG PO TABS
100.0000 mg | ORAL_TABLET | Freq: Two times a day (BID) | ORAL | Status: DC
  Administered 2021-02-03 – 2021-02-08 (×10): 100 mg via ORAL
  Filled 2021-02-03 (×10): qty 1

## 2021-02-03 MED ORDER — URSODIOL 300 MG PO CAPS
300.0000 mg | ORAL_CAPSULE | Freq: Three times a day (TID) | ORAL | Status: DC
  Administered 2021-02-03 – 2021-02-08 (×13): 300 mg via ORAL
  Filled 2021-02-03 (×14): qty 1

## 2021-02-03 MED ORDER — URSODIOL 250 MG PO TABS: 250.0000 mg | ORAL_TABLET | Freq: Two times a day (BID) | ORAL | Status: DC

## 2021-02-03 MED ORDER — LINAGLIPTIN 5 MG PO TABS
5.0000 mg | ORAL_TABLET | Freq: Every day | ORAL | Status: DC
  Administered 2021-02-03 – 2021-02-08 (×6): 5 mg via ORAL
  Filled 2021-02-03 (×6): qty 1

## 2021-02-03 MED ORDER — ACETAMINOPHEN 500 MG PO TABS
1000.0000 mg | ORAL_TABLET | Freq: Once | ORAL | Status: AC
  Administered 2021-02-03: 1000 mg via ORAL
  Filled 2021-02-03: qty 2

## 2021-02-03 MED ORDER — KETOROLAC TROMETHAMINE 30 MG/ML IJ SOLN: 30.0000 mg | Freq: Four times a day (QID) | INTRAMUSCULAR | Status: DC | PRN

## 2021-02-03 MED ORDER — INSULIN ASPART 100 UNIT/ML IJ SOLN
0.0000 [IU] | Freq: Three times a day (TID) | INTRAMUSCULAR | Status: DC
  Administered 2021-02-03: 3 [IU] via SUBCUTANEOUS
  Administered 2021-02-05: 2 [IU] via SUBCUTANEOUS
  Administered 2021-02-05 – 2021-02-06 (×2): 3 [IU] via SUBCUTANEOUS
  Administered 2021-02-06: 2 [IU] via SUBCUTANEOUS
  Administered 2021-02-06: 3 [IU] via SUBCUTANEOUS
  Administered 2021-02-07: 2 [IU] via SUBCUTANEOUS
  Administered 2021-02-07 – 2021-02-08 (×3): 3 [IU] via SUBCUTANEOUS

## 2021-02-03 MED ORDER — MULTIVITAMIN ADULT PO TABS: ORAL_TABLET | Freq: Every day | ORAL | Status: DC

## 2021-02-03 MED ORDER — POLYETHYLENE GLYCOL 3350 17 G PO PACK
17.0000 g | PACK | Freq: Every day | ORAL | Status: DC
  Administered 2021-02-04 – 2021-02-08 (×5): 17 g via ORAL
  Filled 2021-02-03 (×5): qty 1

## 2021-02-03 MED ORDER — ALBUTEROL SULFATE (2.5 MG/3ML) 0.083% IN NEBU
2.5000 mg | INHALATION_SOLUTION | Freq: Four times a day (QID) | RESPIRATORY_TRACT | Status: DC | PRN
  Administered 2021-02-05: 2.5 mg via RESPIRATORY_TRACT
  Filled 2021-02-03: qty 3

## 2021-02-03 MED ORDER — SIMVASTATIN 20 MG PO TABS
20.0000 mg | ORAL_TABLET | Freq: Every day | ORAL | Status: DC
  Administered 2021-02-03 – 2021-02-07 (×5): 20 mg via ORAL
  Filled 2021-02-03 (×5): qty 1

## 2021-02-03 MED ORDER — CALCIUM CARBONATE-VITAMIN D 500-200 MG-UNIT PO TABS
1.0000 | ORAL_TABLET | Freq: Two times a day (BID) | ORAL | Status: DC
  Administered 2021-02-03 – 2021-02-04 (×3): 1 via ORAL
  Filled 2021-02-03 (×5): qty 1

## 2021-02-03 MED ORDER — CALCIUM-VITAMIN D 600-400 MG-UNIT PO TABS: 1.0000 | ORAL_TABLET | Freq: Two times a day (BID) | ORAL | Status: DC

## 2021-02-03 MED ORDER — TAMSULOSIN HCL 0.4 MG PO CAPS
0.4000 mg | ORAL_CAPSULE | Freq: Every day | ORAL | Status: DC
  Administered 2021-02-03 – 2021-02-08 (×6): 0.4 mg via ORAL
  Filled 2021-02-03 (×7): qty 1

## 2021-02-03 MED ORDER — ACETAMINOPHEN 650 MG RE SUPP: 650.0000 mg | Freq: Four times a day (QID) | RECTAL | Status: DC | PRN

## 2021-02-03 MED ORDER — THIAMINE HCL 100 MG/ML IJ SOLN
250.0000 mg | Freq: Every day | INTRAVENOUS | Status: DC
  Administered 2021-02-05 – 2021-02-07 (×3): 250 mg via INTRAVENOUS
  Filled 2021-02-03 (×2): qty 2
  Filled 2021-02-03 (×2): qty 2.5

## 2021-02-03 MED ORDER — METFORMIN HCL 500 MG PO TABS
1000.0000 mg | ORAL_TABLET | Freq: Two times a day (BID) | ORAL | Status: DC
  Administered 2021-02-03 – 2021-02-04 (×2): 1000 mg via ORAL
  Filled 2021-02-03 (×2): qty 2

## 2021-02-03 MED ORDER — URSODIOL 60 MG/ML SUSP
250.0000 mg | Freq: Three times a day (TID) | ORAL | Status: DC
  Filled 2021-02-03: qty 4.2

## 2021-02-03 MED ORDER — LISINOPRIL 20 MG PO TABS
30.0000 mg | ORAL_TABLET | Freq: Every day | ORAL | Status: DC
  Administered 2021-02-04 – 2021-02-05 (×2): 30 mg via ORAL
  Filled 2021-02-03 (×2): qty 1

## 2021-02-03 MED ORDER — MELOXICAM 7.5 MG PO TABS
15.0000 mg | ORAL_TABLET | Freq: Every day | ORAL | Status: DC
  Administered 2021-02-03 – 2021-02-04 (×2): 15 mg via ORAL
  Filled 2021-02-03 (×3): qty 2

## 2021-02-03 MED ORDER — ADULT MULTIVITAMIN W/MINERALS CH
1.0000 | ORAL_TABLET | Freq: Every day | ORAL | Status: DC
  Administered 2021-02-03 – 2021-02-08 (×6): 1 via ORAL
  Filled 2021-02-03 (×7): qty 1

## 2021-02-03 MED ORDER — TRIAMCINOLONE ACETONIDE 0.1 % EX CREA
1.0000 "application " | TOPICAL_CREAM | Freq: Two times a day (BID) | CUTANEOUS | Status: DC
  Administered 2021-02-03 – 2021-02-08 (×11): 1 via TOPICAL
  Filled 2021-02-03: qty 15

## 2021-02-03 MED ORDER — ENOXAPARIN SODIUM 40 MG/0.4ML IJ SOSY
40.0000 mg | PREFILLED_SYRINGE | INTRAMUSCULAR | Status: DC
  Administered 2021-02-04: 40 mg via SUBCUTANEOUS
  Filled 2021-02-03: qty 0.4

## 2021-02-03 MED ORDER — PREGABALIN 100 MG PO CAPS
300.0000 mg | ORAL_CAPSULE | Freq: Two times a day (BID) | ORAL | Status: DC
Start: 2021-02-03 — End: 2021-02-08
  Administered 2021-02-03 – 2021-02-08 (×10): 300 mg via ORAL
  Filled 2021-02-03 (×11): qty 3

## 2021-02-03 MED ORDER — LIDOCAINE 5 % EX PTCH
1.0000 | MEDICATED_PATCH | CUTANEOUS | 0 refills | Status: DC
  Filled 2021-02-03: qty 30, 30d supply, fill #0

## 2021-02-03 MED ORDER — PREGABALIN 25 MG PO CAPS: 225.0000 mg | ORAL_CAPSULE | Freq: Two times a day (BID) | ORAL | Status: DC

## 2021-02-03 MED ORDER — IMMUNE GLOBULIN (HUMAN) 10 GM/100ML IV SOLN
400.0000 mg/kg | INTRAVENOUS | Status: AC
  Administered 2021-02-03 – 2021-02-07 (×5): 50 g via INTRAVENOUS
  Filled 2021-02-03: qty 100
  Filled 2021-02-03: qty 400
  Filled 2021-02-03: qty 500
  Filled 2021-02-03 (×3): qty 400

## 2021-02-03 MED ORDER — SODIUM CHLORIDE 0.9 % IV BOLUS
500.0000 mL | Freq: Once | INTRAVENOUS | Status: AC
  Administered 2021-02-03: 500 mL via INTRAVENOUS

## 2021-02-03 MED ORDER — NALOXONE HCL 2 MG/2ML IJ SOSY: 1.0000 mg | PREFILLED_SYRINGE | Freq: Once | INTRAMUSCULAR | Status: DC

## 2021-02-03 MED ORDER — DICLOFENAC SODIUM 1 % EX GEL
2.0000 g | Freq: Four times a day (QID) | CUTANEOUS | Status: DC
  Administered 2021-02-03 – 2021-02-08 (×17): 2 g via TOPICAL
  Filled 2021-02-03: qty 100

## 2021-02-03 NOTE — ED Notes (Signed)
Neuro at BS.

## 2021-02-03 NOTE — Progress Notes (Signed)
Attempted MRI as requested. Sides of the scanner were too tight for patient to tolerate and unable to get pt into scanner far enough to perform the exam.

## 2021-02-03 NOTE — ED Notes (Signed)
Up at [facility] to use urinal.

## 2021-02-03 NOTE — Plan of Care (Signed)
Pt arrived to the unit. A/O x4, RA, walking with a walker/SOB with movement, VS stable. Bed in low position, alarms are on, call bell in reach. Belongings at the bedside

## 2021-02-03 NOTE — ED Notes (Signed)
Admitting at BS

## 2021-02-03 NOTE — ED Notes (Signed)
To MRI

## 2021-02-03 NOTE — ED Notes (Addendum)
Back from MRI, unable to perform/start d/t girth to great, did not fit. Alert, NAD, calm, no changes.

## 2021-02-03 NOTE — ED Notes (Addendum)
Diet/ meal ordered. Lovenox held d/t pending LP. Waiting on verification of meds.

## 2021-02-03 NOTE — H&P (Signed)
History and Physical    Jeremy Bauer YBF:[id number] DOB: 04/19/1966 DOA: 02/02/2021  PCP: Jeremy Stain, MD (Confirm with patient/family/NH records and if not entered, this has to be entered at [facility] point of entry) Patient coming from: Home  I have personally briefly reviewed patient's old medical records in [place]  Chief Complaint: My legs are weak  HPI: Jeremy Bauer is a 55 y.o. male with medical history significant of IIDM, diabetic neuropathy, HTN, HLD, morbid obesity status post sleeve gastrectomy, polysubstance abuse cocaine and alcohol, chronic back pain, chronic ambulation dysfunction using rolling walker to ambulate, came with worsening of leg weakness.  At baseline, patient has had poor control Diabetic neuropathy with pins-and-needles sensation of bilateral feet and "walking on clouds" he has been taking Lyrica but with minimal effect.  Has been using rolling walker to ambulate for last 2 weeks.  Yesterday patient woke up and felt his legs were significantly weaker than before, she was able to stand his feet but unable to walk. He also complains about his back pain has been poorly controlled. But saying that the back pain has always been localized, not irradiating , he also denied any trouble urinate or with BM, no numbness on the saddle area.  Patient used cocaine yesterday, but denied any chest pain.  ED Course: ED tried to ambulate the patient but patient not able to. Rectal exam showed a normal rectal tone. Neuro exam non focal. Neurology consulted and ordered MRI of lumbar and thoracic spine.  Review of Systems: As per HPI otherwise 14 point review of systems negative.    Past Medical History:  Diagnosis Date   Diabetes mellitus without complication ([place])    Hyperlipidemia    Hypertension     Past Surgical History:  Procedure Laterality Date   BARIATRIC SURGERY       reports that he has never smoked. He has never used smokeless tobacco. No history on  file for alcohol use and drug use.  No Known Allergies  History reviewed. No pertinent family history.   Prior to Admission medications   Medication Sig Start Date End Date Taking? Authorizing Provider  lidocaine (LIDODERM) 5 % Place 1 patch onto the skin daily. Remove & Discard patch within 12 hours or as directed by MD 02/03/21  Yes Jeremy Bauer, April, MD  Accu-Chek Softclix Lancets lancets Use as directed 01/16/21   Jeremy Stain, MD  Albuterol Sulfate (PROAIR RESPICLICK) 606 (90 Base) MCG/ACT AEPB Inhale 2 puffs into the lungs every 6 (six) hours as needed. 12/29/20   Jeremy Stain, MD  Blood Glucose Monitoring Suppl (ACCU-CHEK GUIDE) w/Device KIT use as directed 01/16/21   Jeremy Stain, MD  Calcium Carb-Cholecalciferol (CALCIUM-VITAMIN D) 600-400 MG-UNIT TABS Take 1 tablet by mouth 2 (two) times daily.    [provider]  diclofenac Sodium (VOLTAREN) 1 % GEL Apply 2 g topically 4 (four) times daily.    [provider]  Dulaglutide (TRULICITY) 0.04 HT/9.7FS SOPN Inject 0.75 mg into the skin once a week. 01/16/21   Jeremy Stain, MD  furosemide (LASIX) 40 MG tablet Take 1 tablet (40 mg total) by mouth daily as needed. 01/04/21   Jeremy Stain, MD  glipiZIDE (GLUCOTROL) 5 MG tablet Take 1 tablet (5 mg total) by mouth 2 (two) times daily. 01/22/21 01/22/22  Shelly Coss, MD  glucose blood (ACCU-CHEK GUIDE) test strip use as directed 01/16/21   Jeremy Stain, MD  lisinopril (ZESTRIL) 30 MG  tablet Take 1 tablet (30 mg total) by mouth daily. 12/29/20   Jeremy Stain, MD  meloxicam (MOBIC) 15 MG tablet Take 1 tablet (15 mg total) by mouth daily. 01/16/21   Jeremy Stain, MD  metFORMIN (GLUCOPHAGE) 1000 MG tablet Take 1,000 mg by mouth 2 (two) times daily with a meal.    [provider]  Multiple Vitamin (MULTIVITAMIN ADULT PO) Take 1 tablet by mouth daily.    [provider]  nystatin powder Apply 1 application topically 3 (three) times  daily. 12/29/20   Jeremy Stain, MD  polyethylene glycol (MIRALAX / GLYCOLAX) 17 g packet Take 17 g by mouth daily. 12/29/20   Jeremy Stain, MD  pregabalin (LYRICA) 225 MG capsule Take 1 capsule (225 mg total) by mouth 2 (two) times daily. 01/16/21   Jeremy Stain, MD  simvastatin (ZOCOR) 20 MG tablet Take 20 mg by mouth daily.    [provider]  tadalafil (CIALIS) 10 MG tablet Take 1 tablet (10 mg total) by mouth every other day as needed for erectile dysfunction. 01/18/21   Jeremy Stain, MD  tamsulosin (FLOMAX) 0.4 MG CAPS capsule Take 1 capsule (0.4 mg total) by mouth daily. 01/16/21   Jeremy Stain, MD  topiramate (TOPAMAX) 100 MG tablet Take 1 tablet (100 mg total) by mouth 2 (two) times daily. 12/29/20   Jeremy Stain, MD  traZODone (DESYREL) 150 MG tablet Take 150 mg by mouth at bedtime.    [provider]  triamcinolone cream (KENALOG) 0.1 % Apply 1 application topically 2 (two) times daily. 01/16/21   Jeremy Stain, MD  ursodiol (ACTIGALL) 250 MG tablet Take 250 mg by mouth 2 (two) times daily.    [provider]    Physical Exam: Vitals:   02/03/21 1130 02/03/21 1145 02/03/21 1200 02/03/21 1407  BP: 116/64  115/71   Pulse: 74 72 65   Resp: 15 15    Temp:    98.3 F (36.8 C)  TempSrc:    Oral  SpO2: 91% 100% (!) 80%     Constitutional: NAD, calm, comfortable Vitals:   02/03/21 1130 02/03/21 1145 02/03/21 1200 02/03/21 1407  BP: 116/64  115/71   Pulse: 74 72 65   Resp: 15 15    Temp:    98.3 F (36.8 C)  TempSrc:    Oral  SpO2: 91% 100% (!) 80%    Eyes: PERRL, lids and conjunctivae normal ENMT: Mucous membranes are moist. Posterior pharynx clear of any exudate or lesions.Normal dentition.  Neck: normal, supple, no masses, no thyromegaly Respiratory: clear to auscultation bilaterally, no wheezing, no crackles. Normal respiratory effort. No accessory muscle use.  Cardiovascular: Regular rate and rhythm, no murmurs / rubs  / gallops. No extremity edema. 2+ pedal pulses. No carotid bruits.  Abdomen: no tenderness, no masses palpated. No hepatosplenomegaly. Bowel sounds positive.  Musculoskeletal: no clubbing / cyanosis. No joint deformity upper and lower extremities. Good ROM, no contractures. Normal muscle tone.  Skin: no rashes, lesions, ulcers. No induration Neurologic: CN 2-12 grossly intact.  Decreased light touch sensation on bilateral feet, DTR normal. Strength 5/5 in all 4. Straight leg positive above 40 degrees with shooting like pain on B/L thighs. Psychiatric: Normal judgment and insight. Alert and oriented x 3. Normal mood.     Labs on Admission: I have personally reviewed following labs and imaging studies  CBC: Recent Labs  Lab 02/02/21 1731  WBC 7.8  NEUTROABS 3.8  HGB 11.4*  HCT 35.0*  MCV 102.3*  PLT 151   Basic Metabolic Panel: Recent Labs  Lab 01/30/21 0859 02/02/21 1731  NA 140 133*  K 5.1 4.2  CL 105 104  CO2 20 19*  GLUCOSE 148* 176*  BUN 24 12  CREATININE 1.23 1.12  CALCIUM 9.8 9.0   GFR: CrCl cannot be calculated (Unknown ideal weight.). Liver Function Tests: Recent Labs  Lab 01/30/21 0859 02/02/21 1731  AST 42* 40  ALT 81* 75*  ALKPHOS 69 52  BILITOT 0.7 0.5  PROT 7.2 7.0  ALBUMIN 4.0 3.4*   No results for input(s): LIPASE, AMYLASE in the last 168 hours. No results for input(s): AMMONIA in the last 168 hours. Coagulation Profile: No results for input(s): INR, PROTIME in the last 168 hours. Cardiac Enzymes: No results for input(s): CKTOTAL, CKMB, CKMBINDEX, TROPONINI in the last 168 hours. BNP (last 3 results) No results for input(s): PROBNP in the last 8760 hours. HbA1C: No results for input(s): HGBA1C in the last 72 hours. CBG: No results for input(s): GLUCAP in the last 168 hours. Lipid Profile: No results for input(s): CHOL, HDL, LDLCALC, TRIG, CHOLHDL, LDLDIRECT in the last 72 hours. Thyroid Function Tests: No results for input(s): TSH,  T4TOTAL, FREET4, T3FREE, THYROIDAB in the last 72 hours. Anemia Panel: No results for input(s): VITAMINB12, FOLATE, FERRITIN, TIBC, IRON, RETICCTPCT in the last 72 hours. Urine analysis:    Component Value Date/Time   COLORURINE AMBER (A) 02/03/2021 0047   APPEARANCEUR HAZY (A) 02/03/2021 0047   LABSPEC 1.021 02/03/2021 0047   PHURINE 5.0 02/03/2021 0047   GLUCOSEU NEGATIVE 02/03/2021 0047   HGBUR NEGATIVE 02/03/2021 0047   BILIRUBINUR NEGATIVE 02/03/2021 0047   [place] 02/03/2021 0047   PROTEINUR NEGATIVE 02/03/2021 0047   NITRITE NEGATIVE 02/03/2021 0047   LEUKOCYTESUR NEGATIVE 02/03/2021 0047    Radiological Exams on Admission: CT Head Wo Contrast  Result Date: 02/02/2021 CLINICAL DATA:  55 year old male with transient ischemic attack. EXAM: CT HEAD WITHOUT CONTRAST TECHNIQUE: Contiguous axial images were obtained from the base of the skull through the vertex without intravenous contrast. COMPARISON:  Head CT dated 01/20/2021. FINDINGS: Brain: The ventricles and sulci appropriate size for patient's age. The gray-white matter discrimination is preserved. There is no acute intracranial hemorrhage. No mass effect or midline shift no extra-axial fluid collection. Vascular: No hyperdense vessel or unexpected calcification. Skull: Normal. Negative for fracture or focal lesion. Sinuses/Orbits: No acute finding. Other: Similar appearance of sub occipital subcutaneous density may be sequela of prior hemorrhage or scarring. IMPRESSION: Unremarkable noncontrast CT of the brain. Electronically Signed   By: Anner Crete M.D.   On: 02/02/2021 18:38   CT L-SPINE NO CHARGE  Result Date: 02/03/2021 CLINICAL DATA:  55 year old male with flank pain, severe low back pain and developing bilateral leg weakness. EXAM: CT LUMBAR SPINE WITHOUT CONTRAST TECHNIQUE: Technique: Multiplanar CT images of the lumbar spine were reconstructed from contemporary CT of the Abdomen and Pelvis. CONTRAST:  None.  COMPARISON:  CT Abdomen and Pelvis today are reported separately. FINDINGS: Segmentation: Normal. Alignment: Normal lumbar segmentation.  No spondylolisthesis. Vertebrae: Bone mineralization is within normal limits. No acute osseous abnormality identified. Intact visible sacrum and SI joints with degenerative bilateral SI joint vacuum phenomena. Paraspinal and other soft tissues: Aortoiliac calcified atherosclerosis. Abdominal viscera are reported separately today. Lumbar paraspinal soft tissues remain within normal limits. Disc levels: T11-T12: Moderate facet hypertrophy. Circumferential disc bulge and endplate spurring. Mild to moderate spinal stenosis (series 1,  image 22). Moderate to severe bilateral T11 foraminal stenosis. T12-L1: Mostly far lateral disc bulging and endplate spurring. No definite stenosis. L1-L2: Mostly far lateral disc bulging and endplate spurring. No definite stenosis. L2-L3: Disc space loss and vacuum disc. Circumferential disc osteophyte complex. Mild facet hypertrophy. Up to mild spinal stenosis. Mild to moderate L2 foraminal stenosis greater on the right. L3-L4: Mostly far lateral. Mild to moderate disc bulging and endplate facet spurring hypertrophy. No convincing spinal stenosis. Mild to moderate L3 foraminal stenosis greater on the left. L4-L5: Circumferential disc bulge and endplate spurring. Moderate to severe facet hypertrophy with vacuum facet greater on the left. But no convincing spinal stenosis. Moderate to severe right greater than left L4 foraminal stenosis. L5-S1: Disc space loss. Circumferential disc bulge with endplate spurring. Moderate to severe facet hypertrophy greater on the right with prominent vacuum facet there. No convincing spinal stenosis. Moderate to severe left and severe right L5 foraminal stenosis. IMPRESSION: 1. No acute osseous abnormality in the lumbar spine. 2. The most convincing visible level of spinal stenosis is in the lower thoracic spine at  T11-T12, related to bulky disc and posterior element hypertrophy. Query lower Thoracic Myelopathy. 3. Lumbar spine degeneration, with severe facet arthropathy L4-L5 and L5-S1. Severe disc degeneration L2-L3. But only mild lumbar spinal stenosis is suspected. Multilevel moderate and severe lumbar neural foraminal stenosis, maximal at the L4 and L5 nerve levels. 4. CT Abdomen and Pelvis today are reported separately. Aortic Atherosclerosis (ICD10-I70.0). Electronically Signed   By: Genevie Ann M.D.   On: 02/03/2021 04:45   CT Renal Stone Study  Result Date: 02/03/2021 CLINICAL DATA:  55 year old male with flank pain, severe low back pain and developing bilateral leg weakness. EXAM: CT ABDOMEN AND PELVIS WITHOUT CONTRAST TECHNIQUE: Multidetector CT imaging of the abdomen and pelvis was performed following the standard protocol without IV contrast. COMPARISON:  CT lumbar spine today reported separately. Noncontrast CT Chest, Abdomen and Pelvis 01/20/2021 FINDINGS: Lower chest: Lung bases are stable and negative. No pericardial or pleural effusion. Hepatobiliary: Round rim calcified 2.8 cm gallstone. Gallbladder remains fairly contracted with no adjacent inflammation. Negative noncontrast liver. Pancreas: Negative. Spleen: Negative. Adrenals/Urinary Tract: Normal adrenal glands. Kidneys are stable and nonobstructed. Occasional small simple fluid density renal cysts again noted. No nephrolithiasis. No perinephric inflammation. Ureters are decompressed and negative. Diminutive and unremarkable bladder. Stomach/Bowel: Bilobed fat containing umbilical hernia which tracks caudally and to the right appears stable, noninflamed, and measures up to 12 cm in length (coronal image 29) with a roughly 2 cm hernia neck (sagittal image 80). As before no bowel is herniated. Negative large bowel aside from retained stool, redundancy of the sigmoid. Normal appendix on series 3, image 50. Negative terminal ileum. No dilated small bowel.  Chronic postoperative changes to the greater curve of the stomach. The stomach and duodenum are decompressed. No free air, free fluid, mesenteric inflammation. Vascular/Lymphatic: Normal caliber abdominal aorta. Aortoiliac calcified atherosclerosis. Vascular patency is not evaluated in the absence of IV contrast. No lymphadenopathy. Reproductive: Negative. Other: No pelvic free fluid. Musculoskeletal: Lumbar spine detailed separately. Bulky flowing osteophytes in the visible lower thoracic spine. Stable visualized osseous structures. IMPRESSION: 1. No acute or inflammatory process identified in the non-contrast abdomen or pelvis. Stable noncontrast CT appearance of the abdomen and pelvis from earlier this month. 2. Cholelithiasis. Fat containing umbilical hernia. Prior gastric sleeve. 3. Lumbar Spine CT reported separately. Electronically Signed   By: Genevie Ann M.D.   On: 02/03/2021 04:40  EKG: Independently reviewed. Sinus, no ST-T changes.  Assessment/Plan Active Problems:   Decreased ambulation status   Impaired ambulation  (please populate well all problems here in Problem List. (For example, if patient is on BP meds at home and you resume or decide to hold them, it is a problem that needs to be her. Same for CAD, COPD, HLD and so on)   Acute on chronic ambulation dysfunction -Suspect there is a worsening of lumbar spine radiculopathy.  MRI ordered. -No symptoms or signs of cauda equina syndrome. -Pain control with Tylenol alternated with Toradol. -PT evaluation, expect will need rehab.  IIDM -Most recent A1c 8.8, continue glimepiride and metformin and weekly Trulicity, add Januvia.  Diabetic neuropathy -Poorly controlled, increase Lyrica dose  HTN -Controlled, continue ACEI.  Morbid obesity -Educated about calorie control.  Cocaine abuse -No chest pains.  Educated to quit.  DVT prophylaxis: Lovenox Code Status: Full Code Family Communication: None at bedside Disposition Plan:  Expect less than 2 midnight hospital stay Consults called: NOne Admission status: MedSurg admit   Lequita Halt MD [facility] Pager [phone]  02/03/2021, 2:17 PM

## 2021-02-03 NOTE — ED Provider Notes (Addendum)
[facility] EMERGENCY DEPARTMENT Provider Note   CSN: [id number] Arrival date & time: 02/02/21  1537     History Chief Complaint  Patient presents with   Weakness    Jeremy Bauer is a 55 y.o. male.   Weakness Severity:  Moderate Onset quality:  Sudden Duration:  2 days Timing:  Constant Progression:  Unchanged Chronicity:  New Context: not recent infection and not urinary tract infection   Relieved by:  Nothing Worsened by:  Nothing Ineffective treatments:  None tried Associated symptoms: difficulty walking   Associated symptoms: no abdominal pain, no aphasia, no dysuria, no numbness in extremities, no falls, no fever, no loss of consciousness, no seizures, no sensory-motor deficit, no stroke symptoms, no vision change and no vomiting   Risk factors: diabetes   Patient with type 2 diabetes and peripheral neuropathy who uses a walker at baseline presents feeling off balance for 2 days.  Symptoms started at 2 am 2 days ago feeling wobbly and off balance with acute low back pain and difficulty walking even using his walker all at the same time.  No trauma.  No fever/c/r.   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 No pertinent family history.  Social History   Tobacco Use   Smoking status: Never   Smokeless tobacco: Never    Home Medications Prior to Admission medications   Medication Sig Start Date End Date Taking? Authorizing Provider  Accu-Chek Softclix Lancets lancets Use as directed 01/16/21   Elsie Stain, MD  Albuterol Sulfate (PROAIR RESPICLICK) 086 (90 Base) MCG/ACT AEPB Inhale 2 puffs into the lungs every 6 (six) hours as needed. 12/29/20   Elsie Stain, MD  Blood Glucose Monitoring Suppl (ACCU-CHEK GUIDE) w/Device KIT use as directed 01/16/21   Elsie Stain, MD  Calcium Carb-Cholecalciferol (CALCIUM-VITAMIN D) 600-400 MG-UNIT TABS Take 1 tablet by mouth 2 (two) times daily.    [provider]  diclofenac Sodium (VOLTAREN) 1 % GEL Apply 2 g topically 4 (four) times daily.    [provider]  Dulaglutide (TRULICITY) 5.78 IO/9.6EX SOPN Inject 0.75 mg into the skin once a week. 01/16/21   Elsie Stain, MD  furosemide (LASIX) 40 MG tablet Take 1 tablet (40 mg total) by mouth daily as needed. 01/04/21   Elsie Stain, MD  glipiZIDE (GLUCOTROL) 5 MG tablet Take 1 tablet (5 mg total) by mouth 2 (two) times daily. 01/22/21 01/22/22  Shelly Coss, MD  glucose blood (ACCU-CHEK GUIDE) test strip use as directed 01/16/21   Elsie Stain, MD  lisinopril (ZESTRIL) 30 MG tablet Take  1 tablet (30 mg total) by mouth daily. 12/29/20   Elsie Stain, MD  meloxicam (MOBIC) 15 MG tablet Take 1 tablet (15 mg total) by mouth daily. 01/16/21   Elsie Stain, MD  metFORMIN (GLUCOPHAGE) 1000 MG tablet Take 1,000 mg by mouth 2 (two) times daily with a meal.    [provider]  Multiple Vitamin (MULTIVITAMIN ADULT PO) Take 1 tablet by mouth daily.    [provider]  nystatin powder Apply 1 application topically 3 (three) times daily. 12/29/20   Elsie Stain, MD   polyethylene glycol (MIRALAX / GLYCOLAX) 17 g packet Take 17 g by mouth daily. 12/29/20   Elsie Stain, MD  pregabalin (LYRICA) 225 MG capsule Take 1 capsule (225 mg total) by mouth 2 (two) times daily. 01/16/21   Elsie Stain, MD  simvastatin (ZOCOR) 20 MG tablet Take 20 mg by mouth daily.    [provider]  tadalafil (CIALIS) 10 MG tablet Take 1 tablet (10 mg total) by mouth every other day as needed for erectile dysfunction. 01/18/21   Elsie Stain, MD  tamsulosin (FLOMAX) 0.4 MG CAPS capsule Take 1 capsule (0.4 mg total) by mouth daily. 01/16/21   Elsie Stain, MD  topiramate (TOPAMAX) 100 MG tablet Take 1 tablet (100 mg total) by mouth 2 (two) times daily. 12/29/20   Elsie Stain, MD  traZODone (DESYREL) 150 MG tablet Take 150 mg by mouth at bedtime.    [provider]  triamcinolone cream (KENALOG) 0.1 % Apply 1 application topically 2 (two) times daily. 01/16/21   Elsie Stain, MD  ursodiol (ACTIGALL) 250 MG tablet Take 250 mg by mouth 2 (two) times daily.    [provider]    Allergies    Patient has no known allergies.  Review of Systems   Review of Systems  Constitutional:  Negative for fever.  HENT:  Negative for facial swelling.   Eyes:  Negative for redness.  Respiratory:  Negative for wheezing and stridor.   Cardiovascular:  Negative for leg swelling.  Gastrointestinal:  Negative for abdominal pain and vomiting.  Genitourinary:  Negative for dysuria.  Musculoskeletal:  Positive for gait problem. Negative for falls and neck pain.  Skin:  Negative for rash.  Neurological:  Positive for weakness. Negative for seizures, loss of consciousness and numbness.  Psychiatric/Behavioral:  Negative for agitation.   All other systems reviewed and are negative.  Physical Exam Updated Vital Signs BP 135/87   Pulse 86   Temp 98 F (36.7 C) (Oral)   Resp 18   SpO2 99%   Physical Exam Vitals and nursing note reviewed.   Constitutional:      General: He is not in acute distress.    Appearance: Normal appearance. He is not diaphoretic.  HENT:     Head: Normocephalic and atraumatic.     Nose: Nose normal.  Eyes:     Conjunctiva/sclera: Conjunctivae normal.     Pupils: Pupils are equal, round, and reactive to light.  Cardiovascular:     Rate and Rhythm: Normal rate and regular rhythm.     Pulses: Normal pulses.     Heart sounds: Normal heart sounds.  Pulmonary:     Effort: Pulmonary effort is normal.     Breath sounds: Normal breath sounds.  Abdominal:     General: Bowel sounds are normal.     Palpations: Abdomen is soft.     Tenderness: There is no abdominal  tenderness. There is no guarding.  Musculoskeletal:     Cervical back: Normal range of motion and neck supple.     Right lower leg: No edema.     Left lower leg: No edema.  Skin:    General: Skin is warm and dry.     Capillary Refill: Capillary refill takes less than 2 seconds.     Findings: No rash.  Neurological:     General: No focal deficit present.     Mental Status: He is alert.     Sensory: No sensory deficit.     Motor: No weakness.     Deep Tendon Reflexes: Reflexes normal.     Comments: 5/5 on exam, intact L5/s1   Psychiatric:        Mood and Affect: Mood normal.        Behavior: Behavior normal.    ED Results / Procedures / Treatments   Labs (all labs ordered are listed, but only abnormal results are displayed) Labs Reviewed  COMPREHENSIVE METABOLIC PANEL - Abnormal; Notable for the following components:      Result Value   Sodium 133 (*)    CO2 19 (*)    Glucose, Bld 176 (*)    Albumin 3.4 (*)    ALT 75 (*)    All other components within normal limits  CBC WITH DIFFERENTIAL/PLATELET - Abnormal; Notable for the following components:   RBC 3.42 (*)    Hemoglobin 11.4 (*)    HCT 35.0 (*)    MCV 102.3 (*)    All other components within normal limits  URINALYSIS, ROUTINE W REFLEX MICROSCOPIC - Abnormal; Notable for  the following components:   Color, Urine AMBER (*)    APPearance HAZY (*)    All other components within normal limits  RAPID URINE DRUG SCREEN, HOSP PERFORMED - Abnormal; Notable for the following components:   Cocaine POSITIVE (*)    All other components within normal limits    EKG EKG Interpretation  Date/Time:  Thursday February 02 2021 17:43:07 EDT Ventricular Rate:  92 PR Interval:  180 QRS Duration: 96 QT Interval:  348 QTC Calculation: 430 R Axis:   26 Text Interpretation: Normal sinus rhythm Confirmed by Randal Buba, Jaikob Borgwardt ([id number]) on 02/03/2021 3:24:10 AM  Radiology CT Head Wo Contrast  Result Date: 02/02/2021 CLINICAL DATA:  55 year old male with transient ischemic attack. EXAM: CT HEAD WITHOUT CONTRAST TECHNIQUE: Contiguous axial images were obtained from the base of the skull through the vertex without intravenous contrast. COMPARISON:  Head CT dated 01/20/2021. FINDINGS: Brain: The ventricles and sulci appropriate size for patient's age. The gray-white matter discrimination is preserved. There is no acute intracranial hemorrhage. No mass effect or midline shift no extra-axial fluid collection. Vascular: No hyperdense vessel or unexpected calcification. Skull: Normal. Negative for fracture or focal lesion. Sinuses/Orbits: No acute finding. Other: Similar appearance of sub occipital subcutaneous density may be sequela of prior hemorrhage or scarring. IMPRESSION: Unremarkable noncontrast CT of the brain. Electronically Signed   By: Anner Crete M.D.   On: 02/02/2021 18:38    Procedures Procedures   Medications Ordered in ED Medications - No data to display  ED Course  I have reviewed the triage vital signs and the nursing notes.  Pertinent labs & imaging results that were available during my care of the patient were reviewed by me and considered in my medical decision making (see chart for details).   1220 Case d/w Dr. Rory Percy, given time course something would have  been seen  on head CT, no need for MRI    L spine CT with multiple changes MRI or T and L spine ordered.    If MRI is negative patient may be discharged.  Signed out to AM attending pending MRI results.    Jeremy Bauer was evaluated in Emergency Department on 02/03/2021 for the symptoms described in the history of present illness. He was evaluated in the context of the global COVID-19 pandemic, which necessitated consideration that the patient might be at risk for infection with the SARS-CoV-2 virus that causes COVID-19. Institutional protocols and algorithms that pertain to the evaluation of patients at risk for COVID-19 are in a state of rapid change based on information released by regulatory bodies including the CDC and federal and state organizations. These policies and algorithms were followed during the patient's care in the ED.  Final Clinical Impression(s) / ED Diagnoses Final diagnoses:  None   Signed out to AM attending  Rx / DC Orders ED Discharge Orders     None        Merik Mignano, MD 02/03/21 [place], Lakevia Perris, MD 02/03/21 [id number]

## 2021-02-03 NOTE — Plan of Care (Signed)
  Problem: Education: Goal: Knowledge of General Education information will improve Description Including pain rating scale, medication(s)/side effects and non-pharmacologic comfort measures Outcome: Progressing   

## 2021-02-03 NOTE — ED Notes (Signed)
assisted with bed, repositioned, given blanket, answered questions, updated.

## 2021-02-03 NOTE — ED Notes (Signed)
Pt alert but lethargic. Able to answer questions and follow commands but falls asleep shortly after. Pt nodding off while sitting and doing orthostatic vitals. MD Palumbo aware.

## 2021-02-03 NOTE — ED Notes (Signed)
Pt alert, NAD, calm, interactive, updated, denies questions, repositioned, given pillows. Pending MRI, "not claustrophobic".

## 2021-02-03 NOTE — Consult Note (Signed)
Neurology Consultation  Reason for Consult: Bilateral leg weakness.   Referring Physician: Oris Drone, MD.   CC: Acute onset of BLE weakness 02/01/21.   History is obtained from: patient, chart.   HPI: Jeremy Bauer is a 55 y.o. male with a PMHx of morbid obesity s/p gastric sleeve (and he says partial gastrectomy), HTN, HLD, DM II with peripheral neuropathy, OSA, and polysubstance abuse + for cocaine on UDS 02/03/21. Jeremy Bauer presented to [facility] ED today after acute onset of BLE weakness at 2100 hours 02/01/21.    On Tuesday, 02/01/21 (however, he arrived here on the 28th), he left his shelter and went to [place] by taxi. He shopped inside of [place] in an electric cart and had no difficulties. He came out of [employer] to sit on a bench to wait for a bus. People saw him on the bench. Later, same people saw him still sitting there and brought him to the hospital. Patient states his friends said he was "out of it". Patient does not recall incident after coming out of [place]. He says he has no idea how long he sat on that bench, but lost a few hours. He states he was confused and found out later, that he left his bag and personal items on the bench. He had urinary incontinence that had no forewarning. He states no one said he had seizure-like activity. He did not bite his tongue. He remembers wetting the bed a couple of weeks ago, but he has not had incontinence of bladder again until 02/02/21. Denies bowel incontinence.   He states he has numbness in his feet up to knees due to chronic peripheral neuropathy. Since Tuesday (likely Wed), he has had numbness and tingling to quadriceps level, but not above. However, he does mention occasional hand numbness which comes and goes.   Last Cocaine (crack) use one week ago, but with confrontation of + UDS, he states it was probably 4 days ago. Last ETOH beverage was last week.   He is supposed to be taking Vitamins since gastric surgery, but has not been adherent. He has  had random itching for past 2 weeks. Weight prior to surgery was over 500, then weight loss to 300s, and now back at 425 lbs.   He was here 2 weeks ago with dizziness, weakness, and CP. Symptoms resolved after IVF hydration and CP workup was negative. He denies any recent illness or COVID infection.   Neurology asked to consult for BLE weakness.   ROS: A robust ROS was performed and is negative except as noted in the HPI.   Past Medical History:  Diagnosis Date   Diabetes mellitus without complication ([place])    Hyperlipidemia    Hypertension    History reviewed. No pertinent family history.   Social History:   reports that he has never smoked. He has never used smokeless tobacco. No history on file for alcohol use and drug use. + crack cocaine history and ETOH history.   Medications No current facility-administered medications for this encounter.  Current Outpatient Medications:    lidocaine (LIDODERM) 5 %, Place 1 patch onto the skin daily. Remove & Discard patch within 12 hours or as directed by MD, Disp: 30 patch, Rfl: 0   Accu-Chek Softclix Lancets lancets, Use as directed, Disp: 100 each, Rfl: 12   Albuterol Sulfate (PROAIR RESPICLICK) 080 (90 Base) MCG/ACT AEPB, Inhale 2 puffs into the lungs every 6 (six) hours as needed., Disp: 1 each, Rfl: 4  Blood Glucose Monitoring Suppl (ACCU-CHEK GUIDE) w/Device KIT, use as directed, Disp: 1 kit, Rfl: 0   Calcium Carb-Cholecalciferol (CALCIUM-VITAMIN D) 600-400 MG-UNIT TABS, Take 1 tablet by mouth 2 (two) times daily., Disp: , Rfl:    diclofenac Sodium (VOLTAREN) 1 % GEL, Apply 2 g topically 4 (four) times daily., Disp: , Rfl:    Dulaglutide (TRULICITY) 5.09 TO/6.7TI SOPN, Inject 0.75 mg into the skin once a week., Disp: 2 mL, Rfl: 4   furosemide (LASIX) 40 MG tablet, Take 1 tablet (40 mg total) by mouth daily as needed., Disp: 30 tablet, Rfl: 3   glipiZIDE (GLUCOTROL) 5 MG tablet, Take 1 tablet (5 mg total) by mouth 2 (two) times daily.,  Disp: 60 tablet, Rfl: 0   glucose blood (ACCU-CHEK GUIDE) test strip, use as directed, Disp: 100 strip, Rfl: 12   lisinopril (ZESTRIL) 30 MG tablet, Take 1 tablet (30 mg total) by mouth daily., Disp: 30 tablet, Rfl: 2   meloxicam (MOBIC) 15 MG tablet, Take 1 tablet (15 mg total) by mouth daily., Disp: 30 tablet, Rfl: 0   metFORMIN (GLUCOPHAGE) 1000 MG tablet, Take 1,000 mg by mouth 2 (two) times daily with a meal., Disp: , Rfl:    Multiple Vitamin (MULTIVITAMIN ADULT PO), Take 1 tablet by mouth daily., Disp: , Rfl:    nystatin powder, Apply 1 application topically 3 (three) times daily., Disp: 60 g, Rfl: 2   polyethylene glycol (MIRALAX / GLYCOLAX) 17 g packet, Take 17 g by mouth daily., Disp: 14 each, Rfl: 2   pregabalin (LYRICA) 225 MG capsule, Take 1 capsule (225 mg total) by mouth 2 (two) times daily., Disp: 60 capsule, Rfl: 1   simvastatin (ZOCOR) 20 MG tablet, Take 20 mg by mouth daily., Disp: , Rfl:    tadalafil (CIALIS) 10 MG tablet, Take 1 tablet (10 mg total) by mouth every other day as needed for erectile dysfunction., Disp: 10 tablet, Rfl: 1   tamsulosin (FLOMAX) 0.4 MG CAPS capsule, Take 1 capsule (0.4 mg total) by mouth daily., Disp: 30 capsule, Rfl: 3   topiramate (TOPAMAX) 100 MG tablet, Take 1 tablet (100 mg total) by mouth 2 (two) times daily., Disp: 60 tablet, Rfl: 2   traZODone (DESYREL) 150 MG tablet, Take 150 mg by mouth at bedtime., Disp: , Rfl:    triamcinolone cream (KENALOG) 0.1 %, Apply 1 application topically 2 (two) times daily., Disp: 30 g, Rfl: 0   ursodiol (ACTIGALL) 250 MG tablet, Take 250 mg by mouth 2 (two) times daily., Disp: , Rfl:    Exam: Current vital signs: BP 109/63   Pulse 86   Temp 98 F (36.7 C) (Oral)   Resp 14   SpO2 98%  Vital signs in last 24 hours: Temp:  [98 F (36.7 C)-98.4 F (36.9 C)] 98 F (36.7 C) (07/28 2016) Pulse Rate:  [73-96] 86 (07/29 0930) Resp:  [14-29] 14 (07/29 0800) BP: (109-149)/(61-89) 109/63 (07/29 0930) SpO2:   [94 %-100 %] 98 % (07/29 0930)  PE: GENERAL: well appearing morbidly obese male lying on ED bed. Awake and alert in NAD.   HEENT: normocephalic and atraumatic. Mucus membranes moist. Tongue is not red nor pale/smooth.  LUNGS - Normal respiratory effort.  CV - RRR on tele. ABDOMEN - Soft, nontender. Ext: warm, well perfused. Psych: affect light.   NEURO:  Mental Status: Alert and oriented x3.  Speech/Language: speech is without dysarthria or aphasia.  Naming, repetition, fluency, and comprehension intact. Cranial Nerves:  II: PERRL 3  mm/brisk. visual fields full.  III, IV, VI: EOMI. Lid elevation symmetric and full.  V: sensation is intact and symmetrical to face. Blinks to threat. VII: Smile is symmetrical. Able to puff cheeks and raise eyebrows.  VIII:hearing intact to voice. IX, X: palate elevation is symmetric. Phonation normal.  XI: normal sternocleidomastoid and trapezius muscle strength. SWH:QPRFFM is symmetrical without fasciculations.   Motor:  BUE grip, triceps, biceps 5/5.  Bilateral thighs, knees dorsiflexion and plantar flexion 5/5. Tone is normal. Bulk is normal for his body size.   Sensation- Intact to light touch bilaterally in all four extremities. Extinction absent to DSS. Sharp sensation is intact to bilateral knees, but decreased above knee to quad bilaterally. Sensation to cold is equal.  Coordination: FTN intact bilaterally. No pronator drift.  DTRs:  RUE:  biceps 2+     brachioradialis  2+   triceps 0 RLE:  patella  0    achilles  1+ LUE:  biceps  2+   brachioradialis  2+  triceps 0 LLE: patella  0   achilles  0 Gait- deferred due to falls risk concerns  Labs I have reviewed labs in epic and the results pertinent to this consultation are:   CBC    Component Value Date/Time   WBC 7.8 02/02/2021 1731   RBC 3.42 (L) 02/02/2021 1731   HGB 11.4 (L) 02/02/2021 1731   HCT 35.0 (L) 02/02/2021 1731   PLT 334 02/02/2021 1731   MCV 102.3 (H) 02/02/2021 1731    MCH 33.3 02/02/2021 1731   MCHC 32.6 02/02/2021 1731   RDW 11.9 02/02/2021 1731   LYMPHSABS 2.5 02/02/2021 1731   MONOABS 0.9 02/02/2021 1731   EOSABS 0.5 02/02/2021 1731   BASOSABS 0.1 02/02/2021 1731    CMP     Component Value Date/Time   NA 133 (L) 02/02/2021 1731   NA 140 01/30/2021 0859   K 4.2 02/02/2021 1731   CL 104 02/02/2021 1731   CO2 19 (L) 02/02/2021 1731   GLUCOSE 176 (H) 02/02/2021 1731   BUN 12 02/02/2021 1731   BUN 24 01/30/2021 0859   CREATININE 1.12 02/02/2021 1731   CALCIUM 9.0 02/02/2021 1731   PROT 7.0 02/02/2021 1731   PROT 7.2 01/30/2021 0859   ALBUMIN 3.4 (L) 02/02/2021 1731   ALBUMIN 4.0 01/30/2021 0859   AST 40 02/02/2021 1731   ALT 75 (H) 02/02/2021 1731   ALKPHOS 52 02/02/2021 1731   BILITOT 0.5 02/02/2021 1731   BILITOT 0.7 01/30/2021 0859   GFRNONAA >60 02/02/2021 1731    Imaging MD reviewed the images obtained.  CT head Unremarkable noncontrast CT of the brain  CT Lspine No acute osseous abnormality in the lumbar spine.The most convincing visible level of spinal stenosis is in the lower thoracic spine at T11-T12, related to bulky disc and posterior element hypertrophy. Query lower Thoracic Myelopathy. Lumbar spine degeneration, with severe facet arthropathy L4-L5 and L5-S1. Severe disc degeneration L2-L3. But only mild lumbar spinal stenosis is suspected. Multilevel moderate and severe lumbar neural foraminal stenosis, maximal at the L4 and L5 nerve levels.   Assessment: 55 yo male with c/o BLE weakness, numbness, tingling to hands as well as BLE from feet to quads bilaterally. 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   -- BLE weakness: - No suspicion for stroke as his self reported weakness is bilateral and he had no other stroke symptoms. His loss of time and confusion is  interesting.  - His reported leg stiffness is also described as cramping, which could be  secondary to electrolyte derangement.  - His numbness and tingling could be secondary to hypothyroidism (severe chronic untreated hypothyroism can result in peripheral neuropathy.  - A severe vitamin/mineral deficiency could result in a myelopathy (B12, vitamin E or copper) or a neuropathy (B12, thiamine, pyridoxine).  - The lack of reflexes and weakness may represent GBS and we will need LP under fluoro for testing of CSF before deciding on IVIG.  - Doubt cord compression or cauda equina syndrome as he has strength ratable as 5/5 against resistance provided by examiner and rectal tone was normal. However, a noncompressive lesion such as due to a polyradiculoneuropathy or a myelopathy could result in intermediate loss of motor strength not manifested by the limited resistance which can be provided by examiner against a large-sized and muscular individual, but which could be sufficient to result in weakness when ambulating.   - Unable to do MRIs due to body habitus.  - Just started Trulicity, but side effects are not consistent with patient's presentation.   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 This loss of time could be secondary to an underlying memory deficit in the setting of possible Vitamin deficiency or hypothyroidism. He may also have had a syncopal event while sitting on the bench after leaving [employer].   Recommendations:  -B1 level, then start high dose Thiamine replacement at 500 mg IV TID x 3 days -B12 level, start supplementation if not over 400.  -copper level.  -Vitamin E level -Pyridoxine level -Mg ++ level.  -Vit D level.           -Folate level -HbA1c.  -TSH.  -prealbumin in am.  -Start MVI I po qd for now, then obtain nutrition consult for recommended high-dose vitamin regimen for long term use in the setting of his prior bariatric surgery -LP under fluoroscopy with CSF testing.  -Liver may need workup given elevated  ALT, itching, Cocaine and ETOH history. Defer to medicine team.   Addendum: After LP orders written, NP was called by Dr. Chancy Milroy, radiology. There is not a needle long enough to reach the patient's spine. LP cancelled. Will treat patient empirically for GBS with IVIG 465m/kg qd x 5 days. Also will need to treat possible nutritional myelopathy or neuropathy empirically with multivitamins as above as well as glycemic control.   [facility] and give an update on condition. Told AGlenard Haringthat patient could be here for several days depending on test results. AGlenard Haringstated they could not come to hospital to bring patient clothing.    Pt seen by KClance Boll NP/Neuro and by MD.  Pager: [phone] I have seen and examined the patient. My exam findings were observed and documented by KClance Boll NP.  Electronically signed: Dr. EKerney Elbe

## 2021-02-04 DIAGNOSIS — Z7409 Other reduced mobility: Secondary | ICD-10-CM

## 2021-02-04 LAB — GLUCOSE, CAPILLARY
Glucose-Capillary: 106 mg/dL — ABNORMAL HIGH (ref 70–99)
Glucose-Capillary: 164 mg/dL — ABNORMAL HIGH (ref 70–99)
Glucose-Capillary: 66 mg/dL — ABNORMAL LOW (ref 70–99)
Glucose-Capillary: 95 mg/dL (ref 70–99)
Glucose-Capillary: 139 mg/dL — ABNORMAL HIGH (ref 70–99)

## 2021-02-04 LAB — PREALBUMIN: Prealbumin: 15.1 mg/dL — ABNORMAL LOW (ref 18–38)

## 2021-02-04 MED ORDER — VITAMIN D 25 MCG (1000 UNIT) PO TABS
1000.0000 [IU] | ORAL_TABLET | Freq: Every day | ORAL | Status: DC
  Administered 2021-02-04 – 2021-02-08 (×5): 1000 [IU] via ORAL
  Filled 2021-02-04 (×5): qty 1

## 2021-02-04 MED ORDER — ENOXAPARIN SODIUM 100 MG/ML IJ SOSY
0.5000 mg/kg | PREFILLED_SYRINGE | INTRAMUSCULAR | Status: DC
  Administered 2021-02-05 – 2021-02-08 (×4): 95 mg via SUBCUTANEOUS
  Filled 2021-02-04 (×4): qty 1

## 2021-02-04 MED ORDER — GLUCOSE 40 % PO GEL
ORAL | Status: AC
  Filled 2021-02-04: qty 1

## 2021-02-04 NOTE — Evaluation (Signed)
Physical Therapy Evaluation Patient Details Name: Jeremy Bauer MRN: [id number] DOB: 04/27/1966 Today's Date: 02/04/2021   History of Present Illness  The pt is a 55 yo male presenting 7/15 with c/o blurred vision and hypotension. PMH includes: morbid obesity s/p laparoscopic sleeve gastrectomy, DM II, polyneuropathy, BPH, HTN, OSA, and polysubstance abuse.  Clinical Impression  Patient seen for PT evaluation and mobility assessment. At this time, patient is limited in overall activity tolerance and mobility as indicated below. Patient does not feel that he can safely manage on his own as he is currently homeless and taking shelter at [facility]. Patient VSS but overall feels that he is deconditioned. Encouraged patient to continue mobility with staff and OOB for meals. Will benefit from continued skilled PT to address deficits and maximize recovery. Will recommend ST SNF for increased mobility and conditioning. Will see as indicated and progress as tolerated.    Follow Up Recommendations Supervision for mobility/OOB;SNF (patient states that he does not feel he can manage at [facility] in current condition)    Equipment Recommendations       Recommendations for Other Services       Precautions / Restrictions Precautions Precautions: Fall      Mobility  Bed Mobility Overal bed mobility: Independent             General bed mobility comments: Patient seated in recliner upon entry.    Transfers Overall transfer level: Needs assistance Equipment used: 4-wheeled walker Transfers: Sit to/from Stand Sit to Stand: Supervision         General transfer comment: Supervision to stand from recliner and rolator  Ambulation/Gait Ambulation/Gait assistance: Min guard Gait Distance (Feet): 24 Feet Assistive device: 4-wheeled walker Gait Pattern/deviations: Step-through pattern;Wide base of support     General Gait Details: patient with heavy reliance on rollator during  mobility, had to sit due to LE fatigue after approximately 66ft extended rest break then ambulated back to room.  Patient reports increased pain in bilateral LEs with mobility  Stairs            Wheelchair Mobility    Modified Rankin (Stroke Patients Only)       Balance Overall balance assessment: Mild deficits observed, not formally tested                                           Pertinent Vitals/Pain Pain Assessment: 0-10 Pain Location: 8 Pain Descriptors / Indicators: Aching    Home Living Family/patient expects to be discharged to:: Shelter/[name])               Home Equipment: Dan Humphreys - 4 wheels      Prior Function Level of Independence: Independent         Comments: pt independent, no longer has access to DME, recently acquired rollator. limited in ambulation distance due to fagitue. [facility] provides meals     Hand Dominance   Dominant Hand: Right    Extremity/Trunk Assessment   Upper Extremity Assessment Upper Extremity Assessment: Defer to OT evaluation    Lower Extremity Assessment Lower Extremity Assessment: Generalized weakness    Cervical / Trunk Assessment Cervical / Trunk Assessment: Other exceptions Cervical / Trunk Exceptions: large body habitus  Communication   Communication: No difficulties  Cognition Arousal/Alertness: Awake/alert Behavior During Therapy: WFL for tasks assessed/performed Overall Cognitive Status: Within Functional Limits for  tasks assessed                                        General Comments      Exercises     Assessment/Plan    PT Assessment Patient needs continued PT services  PT Problem List Decreased strength;Decreased activity tolerance;Decreased balance;Decreased mobility;Obesity       PT Treatment Interventions DME instruction;Gait training;Stair training;Functional mobility training;Therapeutic activities;Therapeutic  exercise;Balance training;Patient/family education    PT Goals (Current goals can be found in the Care Plan section)  Acute Rehab PT Goals Patient Stated Goal: start aquatic exercise program PT Goal Formulation: With patient Time For Goal Achievement: 02/04/21 Potential to Achieve Goals: Good    Frequency Min 3X/week   Barriers to discharge        Co-evaluation               AM-PAC PT "6 Clicks" Mobility  Outcome Measure Help needed turning from your back to your side while in a flat bed without using bedrails?: None Help needed moving from lying on your back to sitting on the side of a flat bed without using bedrails?: None Help needed moving to and from a bed to a chair (including a wheelchair)?: A Little Help needed standing up from a chair using your arms (e.g., wheelchair or bedside chair)?: A Little Help needed to walk in hospital room?: A Little Help needed climbing 3-5 steps with a railing? : A Little 6 Click Score: 20    End of Session Equipment Utilized During Treatment: Gait belt Activity Tolerance: Patient tolerated treatment well Patient left: with call bell/phone within reach;in bed Nurse Communication: Mobility status PT Visit Diagnosis: Other abnormalities of gait and mobility (R26.89)    Time: 7342-8768 PT Time Calculation (min) (ACUTE ONLY): 19 min   Charges:   PT Evaluation $PT Eval Moderate Complexity: 1 Mod          Charlotte Crumb, PT DPT  Board Certified Neurologic Specialist Acute Rehabilitation Services Office [phone]   Fabio Asa 02/04/2021, 9:41 AM

## 2021-02-04 NOTE — Progress Notes (Signed)
PROGRESS NOTE    Jeremy Bauer  WER:[id number] DOB: 1965/07/17 DOA: 02/02/2021 PCP: Storm Frisk, MD   Chief Complain: Weakness of bilateral lower extremities  Brief Narrative:  Patient is a 55 year old male with 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  He was recently admitted and was discharged from here on 7/17 after he was managed for near syncopal episode/hypotension/AKI.  He uses cocaine and his last cocaine use was few days ago.  Patient was admitted for the management of bilateral lower extremity weakness of unknown etiology.  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  PT consulted and recommended skilled nursing facility on discharge.  Assessment & Plan:   Active Problems:   Decreased ambulation status   Impaired ambulation  Bilateral lower extremity weakness: Sudden onset.  He is usually ambulatory at baseline with the help of walker.  High suspicion for decompensated diabetic neuropathy.  He also complains of numbness, tingling in his bilateral foot. Examination revealed bilateral lower extremity weakness, more on the right.  He feels better today. LP was attempted in the emergency department but there was no needle that there was long enough to reach his spine so LP canceled.  Could not go for MRI because of super morbid obesity.  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 Vitamin B12 level normal. Lumbar spine CT showed degenerative changes, foraminal stenosis.  No acute findings Will follow off for recommendation from neurology. PT recommended skilled nursing facility on discharge.  Currently lives in a shelter.  TOC consulted  Type 2 diabetes with hyperglycemia: Last hemoglobin A1c of 7.5  .Takes metformin ,glipizide ,trajenta at home, recently started on Trulicity.  Continue current insulin regimen.  Monitor blood sugars We might discontinue glipizide on discharge.  Diabetic neuropathy: Secondary to poorly controlled diabetes.  On Lyrica, dose increased  History of hypertension: On ACE inhibitor, lisinopril.  Continue to monitor blood pressure  Substance abuse: Last cocaine intake was few days ago.  Continues to take cocaine despite multiple counseling.  Also abuses of alcohol.  Currently not in withdrawal.  He has also been started on high-dose thiamine IV.  Morbid obesity: BMI of 54.9  History of hyperlipidemia: On Zocor  History of BPH: On Flomax  Vitamin D deficiency: Started on supplementation          DVT prophylaxis:Lovenox Code Status: Full Family Communication: None at bedside Status is: Observation  The patient remains OBS appropriate and will d/c before 2 midnights.  Dispo: The patient is from: Home              Anticipated d/c is to: SNF              Patient currently is not medically stable to d/c.   Difficult to place patient No     Consultants: Neurology  Procedures:None  Antimicrobials:  Anti-infectives (From admission, onward)    None       Subjective:  Patient seen and examined at the bedside today.  Hemodynamically stable during my evaluation.  Continues to have some degree of bilateral lower extremity weakness but feels improved than yesterday.  His right lower extremity was slightly weaker than the left.  Denies other complaints.   Objective: Vitals:   02/03/21 2244 02/03/21 2303 02/04/21 0024 02/04/21 0544  BP: 122/77 121/77 139/71 129/73  Pulse:  85 92 90 86  Resp: Temp: 98 F (36.7 C) 97.9 F (36.6 C) 98 F (36.7 C) 97.8 F (36.6 C)  TempSrc: Oral Oral Oral Oral  SpO2: 98% 98% 99% 97%  Weight:      Height:        Intake/Output Summary (Last 24 hours) at 02/04/2021 1130 Last data filed at  02/04/2021 0858 Gross per 24 hour  Intake 565.68 ml  Output 1725 ml  Net -1159.32 ml   Filed Weights   02/03/21 1657 02/03/21 1746  Weight: (!) 188.8 kg (!) 188.8 kg    Examination:  General exam: Overall comfortable, not in distress, morbidly obese HEENT: PERRL Respiratory system:  no wheezes or crackles  Cardiovascular system: S1 & S2 heard, RRR.  Gastrointestinal system: Abdomen is nondistended, soft and nontender. Central nervous system: Alert and oriented Motor exam: Right upper extremity, left upper extremity: 5/5 Right lower extremity: 3+/5 Left lower extremity: 4/5 Extremities: No edema, no clubbing ,no cyanosis Skin: No rashes, no ulcers,no icterus  .     Data Reviewed: I have personally reviewed following labs and imaging studies  CBC: Recent Labs  Lab 02/02/21 1731  WBC 7.8  NEUTROABS 3.8  HGB 11.4*  HCT 35.0*  MCV 102.3*  PLT 334   Basic Metabolic Panel: Recent Labs  Lab 01/30/21 0859 02/02/21 1731 02/03/21 1339  NA 140 133*  --   K 5.1 4.2  --   CL 105 104  --   CO2 20 19*  --   GLUCOSE 148* 176*  --   BUN 24 12  --   CREATININE 1.23 1.12  --   CALCIUM 9.8 9.0  --   MG  --   --  1.7   GFR: Estimated Creatinine Clearance: 130.2 mL/min (by C-G formula based on SCr of 1.12 mg/dL). Liver Function Tests: Recent Labs  Lab 01/30/21 0859 02/02/21 1731  AST 42* 40  ALT 81* 75*  ALKPHOS 69 52  BILITOT 0.7 0.5  PROT 7.2 7.0  ALBUMIN 4.0 3.4*   No results for input(s): LIPASE, AMYLASE in the last 168 hours. No results for input(s): AMMONIA in the last 168 hours. Coagulation Profile: No results for input(s): INR, PROTIME in the last 168 hours. Cardiac Enzymes: No results for input(s): CKTOTAL, CKMB, CKMBINDEX, TROPONINI in the last 168 hours. BNP (last 3 results) No results for input(s): PROBNP in the last 8760 hours. HbA1C: Recent Labs    02/03/21 1339  HGBA1C 7.5*   CBG: Recent Labs  Lab 02/03/21 1731 02/03/21 2109  02/04/21 0644  GLUCAP 162* 89 106*   Lipid Profile: No results for input(s): CHOL, HDL, LDLCALC, TRIG, CHOLHDL, LDLDIRECT in the last 72 hours. Thyroid Function Tests: Recent Labs    02/03/21 1339  TSH 1.231   Anemia Panel: Recent Labs    02/03/21 1339  VITAMINB12 1,087*  FOLATE 31.5   Sepsis Labs: No results for input(s): PROCALCITON, LATICACIDVEN in the last 168 hours.  Recent Results (from the past 240 hour(s))  Resp Panel by RT-PCR (Flu A&B, Covid) Nasopharyngeal Swab     Status: None   Collection Time: 02/03/21 10:30 AM   Specimen: Nasopharyngeal Swab; Nasopharyngeal(NP) swabs in vial transport medium  Result Value Ref Range Status   SARS Coronavirus 2 by RT PCR NEGATIVE NEGATIVE Final    Comment: (NOTE) SARS-CoV-2 target nucleic acids are NOT DETECTED.  The SARS-CoV-2 RNA is generally detectable in upper respiratory specimens during the acute phase  of infection. The lowest concentration of SARS-CoV-2 viral copies this assay can detect is 138 copies/mL. A negative result does not preclude SARS-Cov-2 infection and should not be used as the sole basis for treatment or other patient management decisions. A negative result may occur with  improper specimen collection/handling, submission of specimen other than nasopharyngeal swab, presence of viral mutation(s) within the areas targeted by this assay, and inadequate number of viral copies(<138 copies/mL). A negative result must be combined with clinical observations, patient history, and epidemiological information. The expected result is Negative.  Fact Sheet for Patients:  BloggerCourse.comhttps://www.fda.gov/media/152166/download  Fact Sheet for Healthcare Providers:  SeriousBroker.ithttps://www.fda.gov/media/152162/download  This test is no t yet approved or cleared by the [place] FDA and  has been authorized for detection and/or diagnosis of SARS-CoV-2 by FDA under an Emergency Use Authorization (EUA). This EUA will remain  in effect  (meaning this test can be used) for the duration of the COVID-19 declaration under Section 564(b)(1) of the Act, 21 U.S.C.section 360bbb-3(b)(1), unless the authorization is terminated  or revoked sooner.       Influenza A by PCR NEGATIVE NEGATIVE Final   Influenza B by PCR NEGATIVE NEGATIVE Final    Comment: (NOTE) The Xpert Xpress SARS-CoV-2/FLU/RSV plus assay is intended as an aid in the diagnosis of influenza from Nasopharyngeal swab specimens and should not be used as a sole basis for treatment. Nasal washings and aspirates are unacceptable for Xpert Xpress SARS-CoV-2/FLU/RSV testing.  Fact Sheet for Patients: BloggerCourse.comhttps://www.fda.gov/media/152166/download  Fact Sheet for Healthcare Providers: SeriousBroker.ithttps://www.fda.gov/media/152162/download  This test is not yet approved or cleared by the [place] FDA and has been authorized for detection and/or diagnosis of SARS-CoV-2 by FDA under an Emergency Use Authorization (EUA). This EUA will remain in effect (meaning this test can be used) for the duration of the COVID-19 declaration under Section 564(b)(1) of the Act, 21 U.S.C. section 360bbb-3(b)(1), unless the authorization is terminated or revoked.  Performed at [facility], [address] [place], [place] [address]          Radiology Studies: CT Head Wo Contrast  Result Date: 02/02/2021 CLINICAL DATA:  55 year old male with transient ischemic attack. EXAM: CT HEAD WITHOUT CONTRAST TECHNIQUE: Contiguous axial images were obtained from the base of the skull through the vertex without intravenous contrast. COMPARISON:  Head CT dated 01/20/2021. FINDINGS: Brain: The ventricles and sulci appropriate size for patient's age. The gray-white matter discrimination is preserved. There is no acute intracranial hemorrhage. No mass effect or midline shift no extra-axial fluid collection. Vascular: No hyperdense vessel or unexpected calcification. Skull: Normal. Negative for fracture or  focal lesion. Sinuses/Orbits: No acute finding. Other: Similar appearance of sub occipital subcutaneous density may be sequela of prior hemorrhage or scarring. IMPRESSION: Unremarkable noncontrast CT of the brain. Electronically Signed   By: Elgie CollardArash  Radparvar M.D.   On: 02/02/2021 18:38   CT L-SPINE NO CHARGE  Result Date: 02/03/2021 CLINICAL DATA:  55 year old male with flank pain, severe low back pain and developing bilateral leg weakness. EXAM: CT LUMBAR SPINE WITHOUT CONTRAST TECHNIQUE: Technique: Multiplanar CT images of the lumbar spine were reconstructed from contemporary CT of the Abdomen and Pelvis. CONTRAST:  None. COMPARISON:  CT Abdomen and Pelvis today are reported separately. FINDINGS: Segmentation: Normal. Alignment: Normal lumbar segmentation.  No spondylolisthesis. Vertebrae: Bone mineralization is within normal limits. No acute osseous abnormality identified. Intact visible sacrum and SI joints with degenerative bilateral SI joint vacuum phenomena. Paraspinal and other soft tissues: Aortoiliac calcified  atherosclerosis. Abdominal viscera are reported separately today. Lumbar paraspinal soft tissues remain within normal limits. Disc levels: T11-T12: Moderate facet hypertrophy. Circumferential disc bulge and endplate spurring. Mild to moderate spinal stenosis (series 1, image 22). Moderate to severe bilateral T11 foraminal stenosis. T12-L1: Mostly far lateral disc bulging and endplate spurring. No definite stenosis. L1-L2: Mostly far lateral disc bulging and endplate spurring. No definite stenosis. L2-L3: Disc space loss and vacuum disc. Circumferential disc osteophyte complex. Mild facet hypertrophy. Up to mild spinal stenosis. Mild to moderate L2 foraminal stenosis greater on the right. L3-L4: Mostly far lateral. Mild to moderate disc bulging and endplate facet spurring hypertrophy. No convincing spinal stenosis. Mild to moderate L3 foraminal stenosis greater on the left. L4-L5: Circumferential  disc bulge and endplate spurring. Moderate to severe facet hypertrophy with vacuum facet greater on the left. But no convincing spinal stenosis. Moderate to severe right greater than left L4 foraminal stenosis. L5-S1: Disc space loss. Circumferential disc bulge with endplate spurring. Moderate to severe facet hypertrophy greater on the right with prominent vacuum facet there. No convincing spinal stenosis. Moderate to severe left and severe right L5 foraminal stenosis. IMPRESSION: 1. No acute osseous abnormality in the lumbar spine. 2. The most convincing visible level of spinal stenosis is in the lower thoracic spine at T11-T12, related to bulky disc and posterior element hypertrophy. Query lower Thoracic Myelopathy. 3. Lumbar spine degeneration, with severe facet arthropathy L4-L5 and L5-S1. Severe disc degeneration L2-L3. But only mild lumbar spinal stenosis is suspected. Multilevel moderate and severe lumbar neural foraminal stenosis, maximal at the L4 and L5 nerve levels. 4. CT Abdomen and Pelvis today are reported separately. Aortic Atherosclerosis (ICD10-I70.0). Electronically Signed   By: Odessa Fleming M.D.   On: 02/03/2021 04:45   CT Renal Stone Study  Result Date: 02/03/2021 CLINICAL DATA:  55 year old male with flank pain, severe low back pain and developing bilateral leg weakness. EXAM: CT ABDOMEN AND PELVIS WITHOUT CONTRAST TECHNIQUE: Multidetector CT imaging of the abdomen and pelvis was performed following the standard protocol without IV contrast. COMPARISON:  CT lumbar spine today reported separately. Noncontrast CT Chest, Abdomen and Pelvis 01/20/2021 FINDINGS: Lower chest: Lung bases are stable and negative. No pericardial or pleural effusion. Hepatobiliary: Round rim calcified 2.8 cm gallstone. Gallbladder remains fairly contracted with no adjacent inflammation. Negative noncontrast liver. Pancreas: Negative. Spleen: Negative. Adrenals/Urinary Tract: Normal adrenal glands. Kidneys are stable and  nonobstructed. Occasional small simple fluid density renal cysts again noted. No nephrolithiasis. No perinephric inflammation. Ureters are decompressed and negative. Diminutive and unremarkable bladder. Stomach/Bowel: Bilobed fat containing umbilical hernia which tracks caudally and to the right appears stable, noninflamed, and measures up to 12 cm in length (coronal image 29) with a roughly 2 cm hernia neck (sagittal image 80). As before no bowel is herniated. Negative large bowel aside from retained stool, redundancy of the sigmoid. Normal appendix on series 3, image 50. Negative terminal ileum. No dilated small bowel. Chronic postoperative changes to the greater curve of the stomach. The stomach and duodenum are decompressed. No free air, free fluid, mesenteric inflammation. Vascular/Lymphatic: Normal caliber abdominal aorta. Aortoiliac calcified atherosclerosis. Vascular patency is not evaluated in the absence of IV contrast. No lymphadenopathy. Reproductive: Negative. Other: No pelvic free fluid. Musculoskeletal: Lumbar spine detailed separately. Bulky flowing osteophytes in the visible lower thoracic spine. Stable visualized osseous structures. IMPRESSION: 1. No acute or inflammatory process identified in the non-contrast abdomen or pelvis. Stable noncontrast CT appearance of the abdomen and pelvis from earlier  this month. 2. Cholelithiasis. Fat containing umbilical hernia. Prior gastric sleeve. 3. Lumbar Spine CT reported separately. Electronically Signed   By: Odessa Fleming M.D.   On: 02/03/2021 04:40        Scheduled Meds:  calcium-vitamin D  1 tablet Oral BID   diclofenac Sodium  2 g Topical QID   enoxaparin (LOVENOX) injection  40 mg Subcutaneous Q24H   glipiZIDE  5 mg Oral BID   insulin aspart  0-15 Units Subcutaneous TID WC   linagliptin  5 mg Oral Daily   lisinopril  30 mg Oral Daily   meloxicam  15 mg Oral Daily   metFORMIN  1,000 mg Oral BID WC   multivitamin with minerals  1 tablet Oral  Daily   polyethylene glycol  17 g Oral Daily   pregabalin  300 mg Oral BID   simvastatin  20 mg Oral QHS   tamsulosin  0.4 mg Oral Daily   [START ON 02/11/2021] thiamine injection  100 mg Intravenous Daily   topiramate  100 mg Oral BID   traZODone  150 mg Oral QHS   triamcinolone cream  1 application Topical BID   ursodiol  300 mg Oral TID   Continuous Infusions:  sodium chloride 250 mL (02/04/21 0540)   Immune Globulin 10% Stopped (02/03/21 2239)   thiamine injection Stopped (02/04/21 0700)   Followed by   Melene Muller ON 02/05/2021] thiamine injection       LOS: 0 days    Time spent:35 mins. More than 50% of that time was spent in counseling and/or coordination of care.      Burnadette Pop, MD [facility] P7/30/2022, 11:30 AM

## 2021-02-05 DIAGNOSIS — Z79899 Other long term (current) drug therapy: Secondary | ICD-10-CM | POA: Diagnosis not present

## 2021-02-05 DIAGNOSIS — Z9884 Bariatric surgery status: Secondary | ICD-10-CM | POA: Diagnosis not present

## 2021-02-05 DIAGNOSIS — M5136 Other intervertebral disc degeneration, lumbar region: Secondary | ICD-10-CM | POA: Diagnosis present

## 2021-02-05 DIAGNOSIS — F1410 Cocaine abuse, uncomplicated: Secondary | ICD-10-CM | POA: Diagnosis present

## 2021-02-05 DIAGNOSIS — Z9119 Patient's noncompliance with other medical treatment and regimen: Secondary | ICD-10-CM | POA: Diagnosis not present

## 2021-02-05 DIAGNOSIS — N401 Enlarged prostate with lower urinary tract symptoms: Secondary | ICD-10-CM | POA: Diagnosis not present

## 2021-02-05 DIAGNOSIS — Z6841 Body Mass Index (BMI) 40.0 and over, adult: Secondary | ICD-10-CM | POA: Diagnosis not present

## 2021-02-05 DIAGNOSIS — E785 Hyperlipidemia, unspecified: Secondary | ICD-10-CM | POA: Diagnosis present

## 2021-02-05 DIAGNOSIS — N179 Acute kidney failure, unspecified: Secondary | ICD-10-CM | POA: Diagnosis present

## 2021-02-05 DIAGNOSIS — G4733 Obstructive sleep apnea (adult) (pediatric): Secondary | ICD-10-CM | POA: Diagnosis present

## 2021-02-05 DIAGNOSIS — F1010 Alcohol abuse, uncomplicated: Secondary | ICD-10-CM | POA: Diagnosis present

## 2021-02-05 DIAGNOSIS — F419 Anxiety disorder, unspecified: Secondary | ICD-10-CM | POA: Diagnosis present

## 2021-02-05 DIAGNOSIS — Z791 Long term (current) use of non-steroidal anti-inflammatories (NSAID): Secondary | ICD-10-CM | POA: Diagnosis not present

## 2021-02-05 DIAGNOSIS — R29898 Other symptoms and signs involving the musculoskeletal system: Secondary | ICD-10-CM | POA: Diagnosis not present

## 2021-02-05 DIAGNOSIS — E1165 Type 2 diabetes mellitus with hyperglycemia: Secondary | ICD-10-CM | POA: Diagnosis present

## 2021-02-05 DIAGNOSIS — Z8673 Personal history of transient ischemic attack (TIA), and cerebral infarction without residual deficits: Secondary | ICD-10-CM | POA: Diagnosis not present

## 2021-02-05 DIAGNOSIS — I10 Essential (primary) hypertension: Secondary | ICD-10-CM | POA: Diagnosis present

## 2021-02-05 DIAGNOSIS — E1142 Type 2 diabetes mellitus with diabetic polyneuropathy: Secondary | ICD-10-CM | POA: Diagnosis present

## 2021-02-05 DIAGNOSIS — N529 Male erectile dysfunction, unspecified: Secondary | ICD-10-CM | POA: Diagnosis present

## 2021-02-05 DIAGNOSIS — M545 Low back pain, unspecified: Secondary | ICD-10-CM | POA: Diagnosis present

## 2021-02-05 DIAGNOSIS — Z5901 Sheltered homelessness: Secondary | ICD-10-CM | POA: Diagnosis not present

## 2021-02-05 DIAGNOSIS — R32 Unspecified urinary incontinence: Secondary | ICD-10-CM | POA: Diagnosis present

## 2021-02-05 DIAGNOSIS — M47816 Spondylosis without myelopathy or radiculopathy, lumbar region: Secondary | ICD-10-CM | POA: Diagnosis present

## 2021-02-05 DIAGNOSIS — R262 Difficulty in walking, not elsewhere classified: Secondary | ICD-10-CM | POA: Diagnosis present

## 2021-02-05 DIAGNOSIS — E559 Vitamin D deficiency, unspecified: Secondary | ICD-10-CM | POA: Diagnosis present

## 2021-02-05 DIAGNOSIS — Z20822 Contact with and (suspected) exposure to covid-19: Secondary | ICD-10-CM | POA: Diagnosis present

## 2021-02-05 LAB — CBC WITH DIFFERENTIAL/PLATELET
Abs Immature Granulocytes: 0.04 10*3/uL (ref 0.00–0.07)
Basophils Absolute: 0 10*3/uL (ref 0.0–0.1)
Basophils Relative: 0 %
Eosinophils Absolute: 0.5 10*3/uL (ref 0.0–0.5)
Eosinophils Relative: 8 %
HCT: 32.2 % — ABNORMAL LOW (ref 39.0–52.0)
Hemoglobin: 10.2 g/dL — ABNORMAL LOW (ref 13.0–17.0)
Immature Granulocytes: 1 %
Lymphocytes Relative: 32 %
Lymphs Abs: 2.2 10*3/uL (ref 0.7–4.0)
MCH: 33 pg (ref 26.0–34.0)
MCHC: 31.7 g/dL (ref 30.0–36.0)
MCV: 104.2 fL — ABNORMAL HIGH (ref 80.0–100.0)
Monocytes Absolute: 0.8 10*3/uL (ref 0.1–1.0)
Monocytes Relative: 13 %
Neutro Abs: 3.1 10*3/uL (ref 1.7–7.7)
Neutrophils Relative %: 46 %
Platelets: 287 10*3/uL (ref 150–400)
RBC: 3.09 MIL/uL — ABNORMAL LOW (ref 4.22–5.81)
RDW: 12 % (ref 11.5–15.5)
WBC: 6.7 10*3/uL (ref 4.0–10.5)
nRBC: 0 % (ref 0.0–0.2)

## 2021-02-05 LAB — COPPER, SERUM: Copper: 127 ug/dL (ref 69–132)

## 2021-02-05 LAB — BASIC METABOLIC PANEL
Anion gap: 6 (ref 5–15)
BUN: 19 mg/dL (ref 6–20)
CO2: 24 mmol/L (ref 22–32)
Calcium: 8.7 mg/dL — ABNORMAL LOW (ref 8.9–10.3)
Chloride: 104 mmol/L (ref 98–111)
Creatinine, Ser: 1.39 mg/dL — ABNORMAL HIGH (ref 0.61–1.24)
GFR, Estimated: 60 mL/min — ABNORMAL LOW (ref >60)
Glucose, Bld: 113 mg/dL — ABNORMAL HIGH (ref 70–99)
Potassium: 4.5 mmol/L (ref 3.5–5.1)
Sodium: 134 mmol/L — ABNORMAL LOW (ref 135–145)

## 2021-02-05 LAB — GLUCOSE, CAPILLARY
Glucose-Capillary: 163 mg/dL — ABNORMAL HIGH (ref 70–99)
Glucose-Capillary: 140 mg/dL — ABNORMAL HIGH (ref 70–99)
Glucose-Capillary: 166 mg/dL — ABNORMAL HIGH (ref 70–99)
Glucose-Capillary: 143 mg/dL — ABNORMAL HIGH (ref 70–99)

## 2021-02-05 NOTE — Plan of Care (Signed)
°  Problem: Education: °Goal: Knowledge of General Education information will improve °Description: Including pain rating scale, medication(s)/side effects and non-pharmacologic comfort measures °Outcome: Progressing °  °Problem: Clinical Measurements: °Goal: Ability to maintain clinical measurements within normal limits will improve °Outcome: Progressing °Goal: Respiratory complications will improve °Outcome: Progressing °  °Problem: Activity: °Goal: Risk for activity intolerance will decrease °Outcome: Progressing °  °Problem: Nutrition: °Goal: Adequate nutrition will be maintained °Outcome: Progressing °  °Problem: Pain Managment: °Goal: General experience of comfort will improve °Outcome: Progressing °  °Problem: Skin Integrity: °Goal: Risk for impaired skin integrity will decrease °Outcome: Progressing °  °

## 2021-02-05 NOTE — [place] FL2 (Signed)
[place] MEDICAID FL2 LEVEL OF CARE SCREENING TOOL     IDENTIFICATION  Patient Name: Jeremy Bauer Birthdate: 06/12/1966 Sex: male Admission Date (Current Location): 02/02/2021  [facility] and [place] Number:  Producer, television/film/video and Address:  [facility] [place], [address], [place], [place] [address]      Provider Number: [id number]  Attending Physician Name and Address:  Burnadette Pop, MD  Relative Name and Phone Number:  Baldo, Hufnagle (Mother)   [phone]    Current Level of Care: Hospital Recommended Level of Care: Skilled Nursing Facility Prior Approval Number:    Date Approved/Denied:   PASRR Number: [id number]  Discharge Plan: SNF    Current Diagnoses: Patient Active Problem List   Diagnosis Date Noted   Decreased ambulation status 02/03/2021   Impaired ambulation 02/03/2021   Weakness of both lower extremities    Cocaine abuse (HCC)    Near syncope 01/20/2021   Type 2 diabetes mellitus (HCC) 01/16/2021   BPH (benign prostatic hyperplasia) 01/16/2021   S/P bariatric surgery 05/01/2019   Erectile dysfunction associated with type 2 diabetes mellitus (HCC) 05/23/2018   S/P laparoscopic sleeve gastrectomy 04/08/2018   OSA treated with BiPAP 06/03/2017   Onychomycosis due to dermatophyte 02/18/2017   Tinea pedis of left foot 02/18/2017   Anxiety 11/22/2016   Diabetic peripheral neuropathy associated with type 2 diabetes mellitus (HCC) 07/14/2016   Morbid (severe) obesity due to excess calories (HCC) 03/26/2015    Orientation RESPIRATION BLADDER Height & Weight     Self, Time, Situation, Place  Normal Continent Weight: (!) 416 lb 3.2 oz (188.8 kg) Height:  6\' 1"  (185.4 cm)  BEHAVIORAL SYMPTOMS/MOOD NEUROLOGICAL BOWEL NUTRITION STATUS      Continent Diet (See DC Summary)  AMBULATORY STATUS COMMUNICATION OF NEEDS Skin   Limited Assist Verbally Normal                       Personal Care Assistance Level of Assistance   Bathing, Feeding, Dressing Bathing Assistance: Limited assistance Feeding assistance: Independent Dressing Assistance: Limited assistance     Functional Limitations Info  Sight, Hearing, Speech Sight Info: Impaired Hearing Info: Adequate Speech Info: Adequate    SPECIAL CARE FACTORS FREQUENCY  PT (By licensed PT), OT (By licensed OT)     PT Frequency: 5x a week OT Frequency: 5x a week            Contractures Contractures Info: Not present    Additional Factors Info  Code Status, Allergies Code Status Info: Full Allergies Info: NKA           Current Medications (02/05/2021):  This is the current hospital active medication list Current Facility-Administered Medications  Medication Dose Route Frequency Provider Last Rate Last Admin   0.9 %  sodium chloride infusion   Intravenous PRN 02/07/2021 T, MD 10 mL/hr at 02/04/21 0540 250 mL at 02/04/21 0540   acetaminophen (TYLENOL) tablet 650 mg  650 mg Oral Q6H PRN 02/06/21, MD       Or   acetaminophen (TYLENOL) suppository 650 mg  650 mg Rectal Q6H PRN Emeline General T, MD       albuterol (PROVENTIL) (2.5 MG/3ML) 0.083% nebulizer solution 2.5 mg  2.5 mg Nebulization Q6H PRN Mikey College, RPH       cholecalciferol (VITAMIN D3) tablet 1,000 Units  1,000 Units Oral Daily Cathie Hoops, MD   1,000 Units at 02/05/21 0955   diclofenac Sodium (  VOLTAREN) 1 % topical gel 2 g  2 g Topical QID Mikey College T, MD   2 g at 02/05/21 1501   enoxaparin (LOVENOX) injection 95 mg  0.5 mg/kg Subcutaneous Q24H Lodema Hong A, RPH   95 mg at 02/05/21 0955   Immune Globulin 10% (PRIVIGEN) IV infusion 50 g  400 mg/kg (Adjusted) Intravenous Q24 Hr x 5 Kirby-Graham, Beather Arbour, NP   Stopped at 02/04/21 2321   insulin aspart (novoLOG) injection 0-15 Units  0-15 Units Subcutaneous TID WC Emeline General, MD   2 Units at 02/05/21 1249   linagliptin (TRADJENTA) tablet 5 mg  5 mg Oral Daily Mikey College T, MD   5 mg at 02/05/21 5638   multivitamin  with minerals tablet 1 tablet  1 tablet Oral Daily Leda Gauze, NP   1 tablet at 02/05/21 0955   polyethylene glycol (MIRALAX / GLYCOLAX) packet 17 g  17 g Oral Daily Mikey College T, MD   17 g at 02/05/21 0956   pregabalin (LYRICA) capsule 300 mg  300 mg Oral BID Mikey College T, MD   300 mg at 02/05/21 0954   simvastatin (ZOCOR) tablet 20 mg  20 mg Oral QHS Mikey College T, MD   20 mg at 02/04/21 2118   tamsulosin (FLOMAX) capsule 0.4 mg  0.4 mg Oral Daily Mikey College T, MD   0.4 mg at 02/05/21 9373   thiamine (B-1) 250 mg in sodium chloride 0.9 % 50 mL IVPB  250 mg Intravenous Daily Leda Gauze, NP 100 mL/hr at 02/05/21 1502 250 mg at 02/05/21 1502   Followed by   Melene Muller ON 02/11/2021] thiamine (B-1) injection 100 mg  100 mg Intravenous Daily Kirby-Graham, Beather Arbour, NP       topiramate (TOPAMAX) tablet 100 mg  100 mg Oral BID Mikey College T, MD   100 mg at 02/05/21 0955   traZODone (DESYREL) tablet 150 mg  150 mg Oral QHS Mikey College T, MD   150 mg at 02/04/21 2118   triamcinolone cream (KENALOG) 0.1 % cream 1 application  1 application Topical BID Mikey College T, MD   1 application at 02/05/21 1001   ursodiol (ACTIGALL) capsule 300 mg  300 mg Oral TID Cathie Hoops, RPH   300 mg at 02/05/21 4287     Discharge Medications: Please see discharge summary for a list of discharge medications.  Relevant Imaging Results:  Relevant Lab Results:   Additional Information SSN: [id number]  Ivette Loyal, [place]

## 2021-02-05 NOTE — Progress Notes (Signed)
PROGRESS NOTE    Jeremy Bauer  WJX:[id number] DOB: 09/10/1965 DOA: 02/02/2021 PCP: Storm Frisk, MD   Chief Complain: Weakness of bilateral lower extremities  Brief Narrative:  Patient is a 55 year old male with 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  He was recently admitted and was discharged from here on 7/17 after he was managed for near syncopal episode/hypotension/AKI.  He uses cocaine and his last cocaine use was few days ago.  Patient was admitted for the management of bilateral lower extremity weakness of unknown etiology.  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  PT consulted and recommended skilled nursing facility on discharge.TOC consulted  Assessment & Plan:   Active Problems:   Decreased ambulation status   Impaired ambulation  Bilateral lower extremity weakness: Sudden onset.  He is usually ambulatory at baseline with the help of walker.  High suspicion for decompensated diabetic neuropathy.  He also complains of numbness, tingling in his bilateral foot. Examination revealed bilateral lower extremity weakness, more on the right.  He feels better today. LP was attempted in the emergency department but there was no needle that there was long enough to reach his spine so LP canceled.  Could not go for MRI because of super morbid obesity.  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 Vitamin B12 level normal. Lumbar spine CT showed degenerative changes, foraminal stenosis.  No acute findings Will follow  for recommendation from neurology.  As per neurology, they will see the patient only after 5 days of IVIG completion(discussed with Dr. Otelia Limes today) Patient reports improvement in the weakness  of his bilateral lower extremities and he was sitting on the edge of the bed today. PT recommended skilled nursing facility on discharge.  Currently lives in a shelter.  TOC consulted.  If he continues to have improvement in the lower extremity weakness, he may not need skilled nursing facility, will follow up with PT, OT  Type 2 diabetes with hyperglycemia: Last hemoglobin A1c of 7.5 .Takes metformin ,glipizide ,trajenta at home, recently started on Trulicity.  Continue current insulin regimen.  Monitor blood sugars We might discontinue glipizide on discharge.  Diabetic neuropathy: Secondary to poorly controlled diabetes.  On Lyrica, dose increased  History of hypertension: On  lisinopril.  Continue to monitor blood pressure.  Blood pressure is soft so we will discontinue lisinopril.    AKI: New problem.  Lisinopril will be held.  Check BMP tomorrow  Substance abuse: Last cocaine intake was few days ago.  Continues to take cocaine despite multiple counseling.  Also abuses of alcohol.  Currently not in withdrawal.  He has also been started on high-dose thiamine IV.  Morbid obesity: BMI of 54.9  History of hyperlipidemia: On Zocor  History of BPH: On Flomax  Vitamin D deficiency: Started on supplementation          DVT prophylaxis:Lovenox Code Status: Full Family Communication: None at bedside Status is: Observation  The patient remains OBS appropriate and will d/c before 2 midnights.  Dispo: The patient is from: Shelter              Anticipated d/c is to: SNF vs Shelter              Patient currently is not medically stable to d/c.   Difficult to place patient No  Consultants: Neurology  Procedures:None  Antimicrobials:  Anti-infectives (From admission, onward)    None       Subjective:  Patient seen and examined at the bedside this morning.  Hemodynamically stable.  He looks much better today.  He feels good and says he has improvement in the lower  extremities weakness.  Denies any new complaints.  Objective: Vitals:   02/04/21 2041 02/04/21 2158 02/04/21 2331 02/05/21 0449  BP: (!) 97/58 121/61 117/66 122/79  Pulse: 76 74 89 88  Resp: 18 18 16 16   Temp: 99.6 F (37.6 C) 99.8 F (37.7 C) 98.2 F (36.8 C) 97.7 F (36.5 C)  TempSrc: Oral Oral Oral Oral  SpO2: 92% 94% 95% 99%  Weight:      Height:        Intake/Output Summary (Last 24 hours) at 02/05/2021 0846 Last data filed at 02/05/2021 0800 Gross per 24 hour  Intake 1154.89 ml  Output 2850 ml  Net -1695.11 ml   Filed Weights   02/03/21 1657 02/03/21 1746  Weight: (!) 188.8 kg (!) 188.8 kg    Examination:  General exam: Overall comfortable, not in distress, morbidly obese HEENT: PERRL Respiratory system:  no wheezes or crackles  Cardiovascular system: S1 & S2 heard, RRR.  Gastrointestinal system: Abdomen is nondistended, soft and nontender. Central nervous system: Alert and oriented Weakness in bilateral lower extremities more on the right Extremities: No edema, no clubbing ,no cyanosis Skin: No rashes, no ulcers,no icterus      Data Reviewed: I have personally reviewed following labs and imaging studies  CBC: Recent Labs  Lab 02/02/21 1731 02/05/21 0209  WBC 7.8 6.7  NEUTROABS 3.8 3.1  HGB 11.4* 10.2*  HCT 35.0* 32.2*  MCV 102.3* 104.2*  PLT 334 287   Basic Metabolic Panel: Recent Labs  Lab 01/30/21 0859 02/02/21 1731 02/03/21 1339 02/05/21 0209  NA 140 133*  --  134*  K 5.1 4.2  --  4.5  CL 105 104  --  104  CO2 20 19*  --  24  GLUCOSE 148* 176*  --  113*  BUN 24 12  --  19  CREATININE 1.23 1.12  --  1.39*  CALCIUM 9.8 9.0  --  8.7*  MG  --   --  1.7  --    GFR: Estimated Creatinine Clearance: 104.9 mL/min (A) (by C-G formula based on SCr of 1.39 mg/dL (H)). Liver Function Tests: Recent Labs  Lab 01/30/21 0859 02/02/21 1731  AST 42* 40  ALT 81* 75*  ALKPHOS 69 52  BILITOT 0.7 0.5  PROT 7.2 7.0  ALBUMIN 4.0 3.4*   No  results for input(s): LIPASE, AMYLASE in the last 168 hours. No results for input(s): AMMONIA in the last 168 hours. Coagulation Profile: No results for input(s): INR, PROTIME in the last 168 hours. Cardiac Enzymes: No results for input(s): CKTOTAL, CKMB, CKMBINDEX, TROPONINI in the last 168 hours. BNP (last 3 results) No results for input(s): PROBNP in the last 8760 hours. HbA1C: Recent Labs    02/03/21 1339  HGBA1C 7.5*   CBG: Recent Labs  Lab 02/04/21 1159 02/04/21 1229 02/04/21 1644 02/04/21 2137 02/05/21 0658  GLUCAP 66* 164* 95 139* 163*   Lipid Profile: No results for input(s): CHOL, HDL, LDLCALC, TRIG, CHOLHDL, LDLDIRECT in the last 72 hours. Thyroid Function Tests: Recent Labs    02/03/21 1339  TSH 1.231   Anemia Panel: Recent Labs    02/03/21 1339  VITAMINB12 1,087*  FOLATE  31.5   Sepsis Labs: No results for input(s): PROCALCITON, LATICACIDVEN in the last 168 hours.  Recent Results (from the past 240 hour(s))  Resp Panel by RT-PCR (Flu A&B, Covid) Nasopharyngeal Swab     Status: None   Collection Time: 02/03/21 10:30 AM   Specimen: Nasopharyngeal Swab; Nasopharyngeal(NP) swabs in vial transport medium  Result Value Ref Range Status   SARS Coronavirus 2 by RT PCR NEGATIVE NEGATIVE Final    Comment: (NOTE) SARS-CoV-2 target nucleic acids are NOT DETECTED.  The SARS-CoV-2 RNA is generally detectable in upper respiratory specimens during the acute phase of infection. The lowest concentration of SARS-CoV-2 viral copies this assay can detect is 138 copies/mL. A negative result does not preclude SARS-Cov-2 infection and should not be used as the sole basis for treatment or other patient management decisions. A negative result may occur with  improper specimen collection/handling, submission of specimen other than nasopharyngeal swab, presence of viral mutation(s) within the areas targeted by this assay, and inadequate number of viral copies(<138  copies/mL). A negative result must be combined with clinical observations, patient history, and epidemiological information. The expected result is Negative.  Fact Sheet for Patients:  BloggerCourse.comhttps://www.fda.gov/media/152166/download  Fact Sheet for Healthcare Providers:  SeriousBroker.ithttps://www.fda.gov/media/152162/download  This test is no t yet approved or cleared by the [place] FDA and  has been authorized for detection and/or diagnosis of SARS-CoV-2 by FDA under an Emergency Use Authorization (EUA). This EUA will remain  in effect (meaning this test can be used) for the duration of the COVID-19 declaration under Section 564(b)(1) of the Act, 21 U.S.C.section 360bbb-3(b)(1), unless the authorization is terminated  or revoked sooner.       Influenza A by PCR NEGATIVE NEGATIVE Final   Influenza B by PCR NEGATIVE NEGATIVE Final    Comment: (NOTE) The Xpert Xpress SARS-CoV-2/FLU/RSV plus assay is intended as an aid in the diagnosis of influenza from Nasopharyngeal swab specimens and should not be used as a sole basis for treatment. Nasal washings and aspirates are unacceptable for Xpert Xpress SARS-CoV-2/FLU/RSV testing.  Fact Sheet for Patients: BloggerCourse.comhttps://www.fda.gov/media/152166/download  Fact Sheet for Healthcare Providers: SeriousBroker.ithttps://www.fda.gov/media/152162/download  This test is not yet approved or cleared by the [place] FDA and has been authorized for detection and/or diagnosis of SARS-CoV-2 by FDA under an Emergency Use Authorization (EUA). This EUA will remain in effect (meaning this test can be used) for the duration of the COVID-19 declaration under Section 564(b)(1) of the Act, 21 U.S.C. section 360bbb-3(b)(1), unless the authorization is terminated or revoked.  Performed at [facility], [address] [place], [place] [id number]          Radiology Studies: No results found.      Scheduled Meds:  cholecalciferol  1,000 Units Oral Daily   diclofenac  Sodium  2 g Topical QID   enoxaparin (LOVENOX) injection  0.5 mg/kg Subcutaneous Q24H   insulin aspart  0-15 Units Subcutaneous TID WC   linagliptin  5 mg Oral Daily   lisinopril  30 mg Oral Daily   multivitamin with minerals  1 tablet Oral Daily   polyethylene glycol  17 g Oral Daily   pregabalin  300 mg Oral BID   simvastatin  20 mg Oral QHS   tamsulosin  0.4 mg Oral Daily   [START ON 02/11/2021] thiamine injection  100 mg Intravenous Daily   topiramate  100 mg Oral BID   traZODone  150 mg Oral QHS   triamcinolone cream  1 application  Topical BID   ursodiol  300 mg Oral TID   Continuous Infusions:  sodium chloride 250 mL (02/04/21 0540)   Immune Globulin 10% Stopped (02/04/21 2321)   thiamine injection 500 mg (02/05/21 0622)   Followed by   thiamine injection       LOS: 0 days    Time spent:35 mins. More than 50% of that time was spent in counseling and/or coordination of care.      Burnadette Pop, MD [facility] P7/31/2022, 8:46 AM

## 2021-02-05 NOTE — TOC Initial Note (Signed)
Transition of Care [facility]) - Initial/Assessment Note    Patient Details  Name: Jeremy Bauer MRN: [id number] Date of Birth: 29-Oct-1965  [facility] CM/SW Contact:    Tresa Endo Phone Number: 02/05/2021, 4:33 PM  Clinical Narrative:                 CSW received SNF consult. CSW met with pt at bedside. CSW introduced self and explained role at the hospital. Pt reports that PTA the the pt was living at [facility]. PT reports pt needs min guard assistance and walked 24 feet. PT reports that pt does not feel like he can mobilize at the homeless shelter in his condition.  CSW reviewed PT/OT recommendations for SNF. Pt reports that he wants to go to a SNF but if he cannot he would like to go back to [facility]. Pt gave CSW permission to fax out to facilities in the area. Pt has no preference of facility at this time. CSW gave pt medicare.gov rating list to review. PT reports they are covid negative and there are no reports in chart of covid vaccinations.   Pt would like CSW to follow up with Glenard Haring at [facility] at [phone], Glenard Haring is his case a Freight forwarder. Pt does not want to loose bed if SNF is not an option. CSW explained that SNF bed is not a guarantee, pt has Medicaid and does not have a place to DC to if he were to go to a SNF.    CSW will continue to follow.    Expected Discharge Plan: [place] Barriers to Discharge: Continued Medical Work up   Patient Goals and CMS Choice Patient states their goals for this hospitalization and ongoing recovery are:: Wants to go to in pt SNF or back to [facility] CMS Medicare.gov Compare Post Acute Care list provided to:: Patient Choice offered to / list presented to : Patient  Expected Discharge Plan and Services Expected Discharge Plan: [place] In-house Referral: Clinical Social Work Discharge Planning Services: NA Post Acute Care Choice: [place] Living  arrangements for the past 2 months: Homeless Shelter                                      Prior Living Arrangements/Services Living arrangements for the past 2 months: [place] with:: Facility Resident, Roommate Patient language and need for interpreter reviewed:: Yes Do you feel safe going back to the place where you live?: Yes      Need for Family Participation in Patient Care: No (Comment) Care giver support system in place?: Yes (comment)   Criminal Activity/Legal Involvement Pertinent to Current Situation/Hospitalization: No - Comment as needed  Activities of Daily Living Home Assistive Devices/Equipment: Eyeglasses, Environmental consultant (specify type) ADL Screening (condition at time of admission) Patient's cognitive ability adequate to safely complete daily activities?: Yes Is the patient deaf or have difficulty hearing?: No Does the patient have difficulty seeing, even when wearing glasses/contacts?: Yes (long distance) Does the patient have difficulty concentrating, remembering, or making decisions?: No Patient able to express need for assistance with ADLs?: Yes Does the patient have difficulty dressing or bathing?: Yes Independently performs ADLs?: No Dressing (OT): Independent Grooming: Independent Feeding: Independent Bathing: Needs assistance Is this a change from baseline?: Pre-admission baseline Toileting: Independent In/Out Bed: Independent with device (comment) (walker) Walks in Home: Independent  with device (comment) Does the patient have difficulty walking or climbing stairs?: Yes Weakness of Legs: Both Weakness of Arms/Hands: None  Permission Sought/Granted Permission sought to share information with : Family Supports, Chartered certified accountant granted to share information with : Yes, Verbal Permission Granted  Share Information with NAME: Auzenne,Patricia (Mother)   [phone]  Permission granted to share info w AGENCY:  SNF  Permission granted to share info w Relationship: Baumgardner,Patricia (Mother)   [phone]  Permission granted to share info w Contact Information: Livermore,Patricia (Mother)   [phone]  Emotional Assessment Appearance:: Appears stated age Attitude/Demeanor/Rapport: Engaged Affect (typically observed): Appropriate Orientation: : Oriented to Place, Oriented to  Time, Oriented to Situation, Oriented to Self Alcohol / Substance Use: Not Applicable Psych Involvement: No (comment)  Admission diagnosis:  Low back pain [M54.50] Cocaine abuse ([place]) [F14.10] Orthostasis [I95.1] DDD (degenerative disc disease), lumbar [M51.36] Leg weakness [R29.898] Impaired ambulation [R26.2] Patient Active Problem List   Diagnosis Date Noted   Decreased ambulation status 02/03/2021   Impaired ambulation 02/03/2021   Weakness of both lower extremities    Cocaine abuse ([place])    Near syncope 01/20/2021   Type 2 diabetes mellitus ([place]) 01/16/2021   BPH (benign prostatic hyperplasia) 01/16/2021   S/P bariatric surgery 05/01/2019   Erectile dysfunction associated with type 2 diabetes mellitus ([place]) 05/23/2018   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 [place] [address] Phone: [phone] Fax: [phone]     Social Determinants of Health (SDOH) Interventions    Readmission Risk Interventions No flowsheet data found.

## 2021-02-06 DIAGNOSIS — G610 Guillain-Barre syndrome: Secondary | ICD-10-CM

## 2021-02-06 LAB — GLUCOSE, CAPILLARY
Glucose-Capillary: 157 mg/dL — ABNORMAL HIGH (ref 70–99)
Glucose-Capillary: 183 mg/dL — ABNORMAL HIGH (ref 70–99)
Glucose-Capillary: 121 mg/dL — ABNORMAL HIGH (ref 70–99)
Glucose-Capillary: 139 mg/dL — ABNORMAL HIGH (ref 70–99)

## 2021-02-06 LAB — BASIC METABOLIC PANEL
Anion gap: 5 (ref 5–15)
BUN: 24 mg/dL — ABNORMAL HIGH (ref 6–20)
CO2: 22 mmol/L (ref 22–32)
Calcium: 8.5 mg/dL — ABNORMAL LOW (ref 8.9–10.3)
Chloride: 107 mmol/L (ref 98–111)
Creatinine, Ser: 1.44 mg/dL — ABNORMAL HIGH (ref 0.61–1.24)
GFR, Estimated: 57 mL/min — ABNORMAL LOW (ref >60)
Glucose, Bld: 129 mg/dL — ABNORMAL HIGH (ref 70–99)
Potassium: 4.4 mmol/L (ref 3.5–5.1)
Sodium: 134 mmol/L — ABNORMAL LOW (ref 135–145)

## 2021-02-06 MED ORDER — CALCIUM CARBONATE-VITAMIN D 500-200 MG-UNIT PO TABS
1.0000 | ORAL_TABLET | Freq: Two times a day (BID) | ORAL | Status: DC
  Administered 2021-02-06 – 2021-02-08 (×5): 1 via ORAL
  Filled 2021-02-06 (×5): qty 1

## 2021-02-06 MED ORDER — SODIUM CHLORIDE 0.9 % IV SOLN: INTRAVENOUS | Status: DC

## 2021-02-06 NOTE — Plan of Care (Signed)
Patient's brother Thayer Ohm called with an update after permission from patient. Thayer Ohm was updated on treatment plan and patient's progress thus far. Thayer Ohm concerned about his brother needing more therapy and possibly a new place to reside other than shelter. NP reassured Thayer Ohm that he will be here until a place is located where he can get extended rehab. NP also told brother that the social worker is the person who works on placed of care after discharge from the hospital.   Jimmye Norman, NP/Neurology.

## 2021-02-06 NOTE — Progress Notes (Signed)
PROGRESS NOTE    Jeremy Bauer  ZOX:[id number] DOB: Dec 14, 1965 DOA: 02/02/2021 PCP: Storm Frisk, MD   Chief Complain: Weakness of bilateral lower extremities  Brief Narrative:  Patient is a 55 year old male with 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  He was recently admitted and was discharged from here on 7/17 after he was managed for near syncopal episode/hypotension/AKI.  He uses cocaine and his last cocaine use was few days ago.  Patient was admitted for the management of bilateral lower extremity weakness of unknown etiology.  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  PT consulted and recommended skilled nursing facility on discharge.TOC consulted  Assessment & Plan:   Active Problems:   Decreased ambulation status   Impaired ambulation  Bilateral lower extremity weakness: Sudden onset.  He is usually ambulatory at baseline with the help of walker.   He also complains of numbness, tingling in his bilateral foot which cud be from diabetic polyneuropathy. LP was attempted in the emergency department but there was no needle that there was long enough to reach his spine so LP canceled.  Could not go for MRI because of super morbid obesity.  Patient was started on IVIG ,day 4/5,empirically for the treatment of AIDP/Guillain-Barr syndrome. Vitamin B12 level normal. Lumbar spine CT showed degenerative changes, foraminal stenosis.  No acute findings Patient reports improvement in the weakness of his bilateral lower extremities   Type 2 diabetes with hyperglycemia: Last hemoglobin A1c of 7.5 .Takes metformin ,glipizide ,trajenta at home, recently started on Trulicity.  Continue current insulin regimen.  Monitor blood sugars We might discontinue  glipizide on discharge.  Diabetic neuropathy: Secondary to poorly controlled diabetes.  On Lyrica, dose increased  History of hypertension: He was on  lisinopril.  Continue to monitor blood pressure.  Blood pressure is soft so we will discontinue lisinopril.    AKI: New problem.  Lisinopril  held.  Check BMP tomorrow.  Started on IV fluids  Substance abuse: Last cocaine intake was few days ago.  Continues to take cocaine despite multiple counseling.  Also abuses of alcohol.  Currently not in withdrawal.  He has also been started on high-dose thiamine IV.  Morbid obesity: BMI of 54.9  History of hyperlipidemia: On Zocor  History of BPH: On Flomax  Vitamin D deficiency: Started on supplementation  Deconditioning/disposition: Patient's lower extremity weakness is improving.Marland KitchenPT/OT recommended skilled nursing facility on discharge.  Currently lives in a shelter.  TOC consulted.  Due to his insurance, he will not get a skilled nursing facility previlege.  PT/OT reconsulted for evaluation          DVT prophylaxis:Lovenox Code Status: Full Family Communication: None at bedside Status is: Inpatient  The patient remains OBS appropriate and will d/c before 2 midnights.  Dispo: The patient is from: Shelter              Anticipated d/c is to: SNF vs Shelter              Patient currently is not medically stable to d/c.   Difficult to place patient No     Consultants: Neurology  Procedures:None  Antimicrobials:  Anti-infectives (From admission, onward)    None       Subjective:  Patient seen and examined the bedside this morning.  Blood pressure was soft this morning, he denies  any dizziness or lightheadedness.  Denies any new complaints.  Weakness on bilateral lower extremities is improving    60 objective: Vitals:   02/05/21 2102 02/05/21 2126 02/05/21 2254 02/06/21 0544  BP: 110/61 106/64 110/68 (!) 91/57  Pulse: 74 79 80 71  Resp: 18 18 18 18   Temp: 99.8 F (37.7  C) 98 F (36.7 C) 98.3 F (36.8 C) 98 F (36.7 C)  TempSrc: Oral Oral Oral Oral  SpO2: 100% 98% 94% 97%  Weight:      Height:        Intake/Output Summary (Last 24 hours) at 02/06/2021 0835 Last data filed at 02/06/2021 0548 Gross per 24 hour  Intake --  Output 2000 ml  Net -2000 ml   Filed Weights   02/03/21 1657 02/03/21 1746  Weight: (!) 188.8 kg (!) 188.8 kg    Examination:  General exam: Overall comfortable, not in distress, morbidly obese HEENT: PERRL Respiratory system:  no wheezes or crackles  Cardiovascular system: S1 & S2 heard, RRR.  Gastrointestinal system: Abdomen is nondistended, soft and nontender. Central nervous system: Alert and oriented Mild Weakness in bilateral lower extremities with power of almost 5/5 on both lower extremities Extremities: No edema, no clubbing ,no cyanosis Skin: No rashes, no ulcers,no icterus      Data Reviewed: I have personally reviewed following labs and imaging studies  CBC: Recent Labs  Lab 02/02/21 1731 02/05/21 0209  WBC 7.8 6.7  NEUTROABS 3.8 3.1  HGB 11.4* 10.2*  HCT 35.0* 32.2*  MCV 102.3* 104.2*  PLT 334 287   Basic Metabolic Panel: Recent Labs  Lab 01/30/21 0859 02/02/21 1731 02/03/21 1339 02/05/21 0209 02/06/21 0140  NA 140 133*  --  134* 134*  K 5.1 4.2  --  4.5 4.4  CL 105 104  --  104 107  CO2 20 19*  --  24 22  GLUCOSE 148* 176*  --  113* 129*  BUN 24 12  --  19 24*  CREATININE 1.23 1.12  --  1.39* 1.44*  CALCIUM 9.8 9.0  --  8.7* 8.5*  MG  --   --  1.7  --   --    GFR: Estimated Creatinine Clearance: 101.2 mL/min (A) (by C-G formula based on SCr of 1.44 mg/dL (H)). Liver Function Tests: Recent Labs  Lab 01/30/21 0859 02/02/21 1731  AST 42* 40  ALT 81* 75*  ALKPHOS 69 52  BILITOT 0.7 0.5  PROT 7.2 7.0  ALBUMIN 4.0 3.4*   No results for input(s): LIPASE, AMYLASE in the last 168 hours. No results for input(s): AMMONIA in the last 168 hours. Coagulation Profile: No results for  input(s): INR, PROTIME in the last 168 hours. Cardiac Enzymes: No results for input(s): CKTOTAL, CKMB, CKMBINDEX, TROPONINI in the last 168 hours. BNP (last 3 results) No results for input(s): PROBNP in the last 8760 hours. HbA1C: Recent Labs    02/03/21 1339  HGBA1C 7.5*   CBG: Recent Labs  Lab 02/05/21 0658 02/05/21 1136 02/05/21 1723 02/05/21 2134 02/06/21 0638  GLUCAP 163* 140* 166* 143* 157*   Lipid Profile: No results for input(s): CHOL, HDL, LDLCALC, TRIG, CHOLHDL, LDLDIRECT in the last 72 hours. Thyroid Function Tests: Recent Labs    02/03/21 1339  TSH 1.231   Anemia Panel: Recent Labs    02/03/21 1339  VITAMINB12 1,087*  FOLATE 31.5   Sepsis Labs: No results for input(s): PROCALCITON, LATICACIDVEN in the last 168 hours.  Recent Results (from the past  240 hour(s))  Resp Panel by RT-PCR (Flu A&B, Covid) Nasopharyngeal Swab     Status: None   Collection Time: 02/03/21 10:30 AM   Specimen: Nasopharyngeal Swab; Nasopharyngeal(NP) swabs in vial transport medium  Result Value Ref Range Status   SARS Coronavirus 2 by RT PCR NEGATIVE NEGATIVE Final    Comment: (NOTE) SARS-CoV-2 target nucleic acids are NOT DETECTED.  The SARS-CoV-2 RNA is generally detectable in upper respiratory specimens during the acute phase of infection. The lowest concentration of SARS-CoV-2 viral copies this assay can detect is 138 copies/mL. A negative result does not preclude SARS-Cov-2 infection and should not be used as the sole basis for treatment or other patient management decisions. A negative result may occur with  improper specimen collection/handling, submission of specimen other than nasopharyngeal swab, presence of viral mutation(s) within the areas targeted by this assay, and inadequate number of viral copies(<138 copies/mL). A negative result must be combined with clinical observations, patient history, and epidemiological information. The expected result is  Negative.  Fact Sheet for Patients:  BloggerCourse.comhttps://www.fda.gov/media/152166/download  Fact Sheet for Healthcare Providers:  SeriousBroker.ithttps://www.fda.gov/media/152162/download  This test is no t yet approved or cleared by the [place] FDA and  has been authorized for detection and/or diagnosis of SARS-CoV-2 by FDA under an Emergency Use Authorization (EUA). This EUA will remain  in effect (meaning this test can be used) for the duration of the COVID-19 declaration under Section 564(b)(1) of the Act, 21 U.S.C.section 360bbb-3(b)(1), unless the authorization is terminated  or revoked sooner.       Influenza A by PCR NEGATIVE NEGATIVE Final   Influenza B by PCR NEGATIVE NEGATIVE Final    Comment: (NOTE) The Xpert Xpress SARS-CoV-2/FLU/RSV plus assay is intended as an aid in the diagnosis of influenza from Nasopharyngeal swab specimens and should not be used as a sole basis for treatment. Nasal washings and aspirates are unacceptable for Xpert Xpress SARS-CoV-2/FLU/RSV testing.  Fact Sheet for Patients: BloggerCourse.comhttps://www.fda.gov/media/152166/download  Fact Sheet for Healthcare Providers: SeriousBroker.ithttps://www.fda.gov/media/152162/download  This test is not yet approved or cleared by the [place] FDA and has been authorized for detection and/or diagnosis of SARS-CoV-2 by FDA under an Emergency Use Authorization (EUA). This EUA will remain in effect (meaning this test can be used) for the duration of the COVID-19 declaration under Section 564(b)(1) of the Act, 21 U.S.C. section 360bbb-3(b)(1), unless the authorization is terminated or revoked.  Performed at [facility], [address] [place], [place] [address]          Radiology Studies: No results found.      Scheduled Meds:  cholecalciferol  1,000 Units Oral Daily   diclofenac Sodium  2 g Topical QID   enoxaparin (LOVENOX) injection  0.5 mg/kg Subcutaneous Q24H   insulin aspart  0-15 Units Subcutaneous TID WC    linagliptin  5 mg Oral Daily   multivitamin with minerals  1 tablet Oral Daily   polyethylene glycol  17 g Oral Daily   pregabalin  300 mg Oral BID   simvastatin  20 mg Oral QHS   tamsulosin  0.4 mg Oral Daily   [START ON 02/11/2021] thiamine injection  100 mg Intravenous Daily   topiramate  100 mg Oral BID   traZODone  150 mg Oral QHS   triamcinolone cream  1 application Topical BID   ursodiol  300 mg Oral TID   Continuous Infusions:  sodium chloride 250 mL (02/04/21 0540)   sodium chloride     Immune  Globulin 10% Stopped (02/05/21 2232)   thiamine injection 250 mg (02/05/21 1502)     LOS: 1 day    Time spent:35 mins. More than 50% of that time was spent in counseling and/or coordination of care.      Burnadette Pop, MD [facility] P8/07/2020, 8:35 AM

## 2021-02-06 NOTE — Evaluation (Signed)
Occupational Therapy Evaluation Patient Details Name: Jeremy Bauer MRN: [id number] DOB: 1966/01/21 Today's Date: 02/06/2021    History of Present Illness The pt is a 55 yo male presenting 7/15 with c/o blurred vision and hypotension. PMH includes: morbid obesity s/p laparoscopic sleeve gastrectomy, DM II, polyneuropathy, BPH, HTN, OSA, and polysubstance abuse.   Clinical Impression   Pt PTA: Pt from a shelter and reports independence. Pt currently, limited by decreased activity tolerance and decreased ability to care for self. Pt set-upA to modA for ADL tasks and supervisionA for rollator with mobility. Pt to be seen 3x weekly for increasing activity tolerance as pt does not have SNF as an option for d/c. Pt would benefit from continued OT skilled services. OT following acutely.    Follow Up Recommendations  Other (comment) (benefit from post acute care; plan to work towards D/C to shelter)    Equipment Recommendations  None recommended by OT    Recommendations for Other Services       Precautions / Restrictions Precautions Precautions: Fall Restrictions Weight Bearing Restrictions: No      Mobility Bed Mobility Overal bed mobility: Independent             General bed mobility comments: EOB upon entry    Transfers Overall transfer level: Needs assistance Equipment used: 4-wheeled walker Transfers: Sit to/from Stand Sit to Stand: Supervision         General transfer comment: supervisionA sit to stand from bed and rollator    Balance Overall balance assessment: Mild deficits observed, not formally tested                                         ADL either performed or assessed with clinical judgement   ADL Overall ADL's : Needs assistance/impaired Eating/Feeding: Modified independent;Sitting   Grooming: Modified independent;Sitting Grooming Details (indicate cue type and reason): able to gatehr materials and place at sink Upper Body Bathing:  Modified independent;Sitting   Lower Body Bathing: Moderate assistance;Sitting/lateral leans;Sit to/from stand Lower Body Bathing Details (indicate cue type and reason): could benefit from [facility] sponge Upper Body Dressing : Modified independent;Sitting   Lower Body Dressing: Moderate assistance;Sitting/lateral leans;Sit to/from stand   Toilet Transfer: Supervision/safety;Ambulation   Toileting- Clothing Manipulation and Hygiene: Supervision/safety;Sit to/from stand       Functional mobility during ADLs: Supervision/safety;Rolling walker;Cueing for safety General ADL Comments: Pt limited by decreased activity tolerance and decreased ability to care for self. Pt set-upA to modA for ADL tasks and supervisionA for rollator.     Vision Baseline Vision/History: Wears glasses Patient Visual Report: No change from baseline Vision Assessment?: No apparent visual deficits     Perception     Praxis      Pertinent Vitals/Pain Pain Assessment: Faces Faces Pain Scale: Hurts little more Pain Location: knees Pain Descriptors / Indicators: Aching Pain Intervention(s): Monitored during session     Hand Dominance Right   Extremity/Trunk Assessment Upper Extremity Assessment Upper Extremity Assessment: Overall WFL for tasks assessed   Lower Extremity Assessment Lower Extremity Assessment: Generalized weakness;Defer to PT evaluation;RLE deficits/detail;LLE deficits/detail RLE Deficits / Details: knee pain LLE Deficits / Details: knee pain   Cervical / Trunk Assessment Cervical / Trunk Assessment: Other exceptions Cervical / Trunk Exceptions: large body habitus   Communication Communication Communication: No difficulties   Cognition Arousal/Alertness: Awake/alert Behavior During Therapy: WFL for tasks assessed/performed Overall Cognitive Status:  Within Functional Limits for tasks assessed                                 General Comments: Pt tangential with decreased  insight to deficits. Poor medical understanding, but likely baseline. pt requires redirection- hyperverbose.   General Comments  VSS on RA. Very concerned about returning to [facility] and feeling weak.    Exercises     Shoulder Instructions      Home Living Family/patient expects to be discharged to:: Shelter/Homeless ([facility])   Available Help at Discharge: Available PRN/intermittently;Friend(s)   Home Access: Level entry     Home Layout: Multi-level                   Additional Comments: Pt cannot go home with brother and may go home to shelter.      Prior Functioning/Environment Level of Independence: Independent        Comments: pt independent, recently acquired rollator. limited in ambulation distance due to knee pain. [facility] provides meals, but pt requires assist to get to food room.        OT Problem List: Decreased strength;Decreased activity tolerance;Pain;Obesity;Decreased knowledge of use of DME or AE      OT Treatment/Interventions: Self-care/ADL training;Therapeutic exercise;Energy conservation;DME and/or AE instruction;Therapeutic activities;Patient/family education;Balance training;Cognitive remediation/compensation    OT Goals(Current goals can be found in the care plan section) Acute Rehab OT Goals Patient Stated Goal: start aquatic exercise program OT Goal Formulation: With patient Time For Goal Achievement: 02/20/21 Potential to Achieve Goals: Good ADL Goals Pt Will Perform Lower Body Dressing: with set-up;sit to/from stand;sitting/lateral leans;with adaptive equipment Additional ADL Goal #1: Pt will perform OOB ADL tasks with with mod I and AD/AE PRN in prep for returning to PLOF. Additional ADL Goal #2: Pt will recall 3 energy conservation techniques for OOB ADL/iADL tasks.  OT Frequency: Min 2X/week   Barriers to D/C:            Co-evaluation              AM-PAC OT "6 Clicks" Daily Activity     Outcome Measure  Help from another person eating meals?: None Help from another person taking care of personal grooming?: A Little Help from another person toileting, which includes using toliet, bedpan, or urinal?: A Little Help from another person bathing (including washing, rinsing, drying)?: A Lot Help from another person to put on and taking off regular upper body clothing?: A Little Help from another person to put on and taking off regular lower body clothing?: A Lot 6 Click Score: 17   End of Session Equipment Utilized During Treatment: Other (comment) (rollator) Nurse Communication: Mobility status  Activity Tolerance: Patient tolerated treatment well Patient left: Other (comment) (up with PT)  OT Visit Diagnosis: Muscle weakness (generalized) (M62.81)                Time: 0102-7253 OT Time Calculation (min): 30 min Charges:  OT General Charges $OT Visit: 1 Visit OT Evaluation $OT Eval Moderate Complexity: 1 Mod OT Treatments $Self Care/Home Management : 8-22 mins  Flora Lipps, OTR/L Acute Rehabilitation Services Pager: [phone] Office: [phone]   Jerilynn Birkenhead 02/06/2021, 4:55 PM

## 2021-02-06 NOTE — Plan of Care (Signed)
  Problem: Education: Goal: Knowledge of General Education information will improve Description: Including pain rating scale, medication(s)/side effects and non-pharmacologic comfort measures Outcome: Progressing   Problem: Clinical Measurements: Goal: Will remain free from infection Outcome: Progressing Goal: Respiratory complications will improve Outcome: Progressing   Problem: Activity: Goal: Risk for activity intolerance will decrease Outcome: Progressing   Problem: Coping: Goal: Level of anxiety will decrease Outcome: Progressing   Problem: Safety: Goal: Ability to remain free from injury will improve Outcome: Progressing

## 2021-02-06 NOTE — Progress Notes (Signed)
Physical Therapy Treatment Patient Details Name: Jeremy Bauer MRN: [id number] DOB: April 23, 1966 Today's Date: 02/06/2021    History of Present Illness The pt is a 55 yo male presenting 7/15 with c/o blurred vision and hypotension. PMH includes: morbid obesity s/p laparoscopic sleeve gastrectomy, DM II, polyneuropathy, BPH, HTN, OSA, and polysubstance abuse.    PT Comments    The pt was seen for continued progression of OOB mobility and endurance training this afternoon. He was able to demo progression in ambulation distance at this time, but continues to endorse fatigue, "heaviness" "cramping" and "tired" sensations in his BLE with exertion that would resolve with 3-5 min seated rest. The pt was able to complete 3 short bouts of hallway ambulation with his rollator ranging from 30 to 70 ft prior to needing seated rest. The pt will continue to benefit from max acute PT services to allow for eventual d/c to shelter, but would benefit most from short stint of rehab to maximize endurance and strength prior to return to [facility]. SpO2 remained steady on RA with exertion through session.   Follow Up Recommendations  Supervision for mobility/OOB (pt will benefit from post acute care; plan to work towards D/C to shelter but he would benefit most from short stint SNF rehab)     Equipment Recommendations  None recommended by PT    Recommendations for Other Services       Precautions / Restrictions Precautions Precautions: Fall Restrictions Weight Bearing Restrictions: No    Mobility  Bed Mobility Overal bed mobility: Independent             General bed mobility comments: standing at sink with OT upon arrival    Transfers Overall transfer level: Needs assistance Equipment used: 4-wheeled walker Transfers: Sit to/from Stand Sit to Stand: Supervision         General transfer comment: supervisionA sit to stand from bed and rollator. pt with very poor eccentric control of  lower  Ambulation/Gait Ambulation/Gait assistance: Min guard Gait Distance (Feet): 30 Feet (+70 ft + 45 ft) Assistive device: 4-wheeled walker Gait Pattern/deviations: Step-through pattern;Wide base of support   Gait velocity interpretation: <1.31 ft/sec, indicative of household ambulator General Gait Details: pt with progressive reliance on rollator for support, increased lateral sway with fatigue. completed 30 ft before needing 3 min seated rest, then 70 ft with max encouragement before needing 5 min seated rest, and then 45 ft back to room with seated rest after arrival.      Balance Overall balance assessment: Mild deficits observed, not formally tested                                          Cognition Arousal/Alertness: Awake/alert Behavior During Therapy: WFL for tasks assessed/performed Overall Cognitive Status: Within Functional Limits for tasks assessed                                 General Comments: Pt tangential with decreased insight to deficits. Poor medical understanding, but likely baseline. pt requires redirection- hyperverbose.      Exercises Other Exercises Other Exercises: standing calf stretch as pt described LE cramping, but this did not touch his pain    General Comments General comments (skin integrity, edema, etc.): VSS on RA, HR 98 with exertion of hallway ambulation  Pertinent Vitals/Pain Pain Assessment: Faces Faces Pain Scale: Hurts a little bit Pain Location: pt using variety of decriptions for LE discomfort that progressively worsens with activity. he reports cramping, heaviness, and tired feeling in his entire leg with progressive activity Pain Descriptors / Indicators: Cramping;Heaviness;Discomfort Pain Intervention(s): Limited activity within patient's tolerance;Monitored during session    Home Living Family/patient expects to be discharged to:: Shelter/Homeless ([facility])   Available Help at  Discharge: Available PRN/intermittently;Friend(s)   Home Access: Level entry   Home Layout: Multi-level   Additional Comments: Pt cannot go home with brother and may go home to shelter.    Prior Function Level of Independence: Independent      Comments: pt independent, recently acquired rollator. limited in ambulation distance due to knee pain. [facility] provides meals, but pt requires assist to get to food room.   PT Goals (current goals can now be found in the care plan section) Acute Rehab PT Goals Patient Stated Goal: start aquatic exercise program PT Goal Formulation: With patient Time For Goal Achievement: 02/20/21 Potential to Achieve Goals: Good Progress towards PT goals: Progressing toward goals    Frequency    Min 3X/week      PT Plan Current plan remains appropriate       AM-PAC PT "6 Clicks" Mobility   Outcome Measure  Help needed turning from your back to your side while in a flat bed without using bedrails?: None Help needed moving from lying on your back to sitting on the side of a flat bed without using bedrails?: None Help needed moving to and from a bed to a chair (including a wheelchair)?: A Little Help needed standing up from a chair using your arms (e.g., wheelchair or bedside chair)?: A Little Help needed to walk in hospital room?: A Little Help needed climbing 3-5 steps with a railing? : A Little 6 Click Score: 20    End of Session Equipment Utilized During Treatment: Gait belt Activity Tolerance: Patient tolerated treatment well Patient left: with call bell/phone within reach;in chair Nurse Communication: Mobility status PT Visit Diagnosis: Other abnormalities of gait and mobility (R26.89)     Time: 4193-7902 PT Time Calculation (min) (ACUTE ONLY): 23 min  Charges:  $Gait Training: 23-37 mins                     Lazarus Gowda, PT, DPT   Acute Rehabilitation Department Pager #: [phone]   Gaetana Michaelis 02/06/2021,  6:07 PM

## 2021-02-06 NOTE — Progress Notes (Addendum)
Neurology Progress Note  S: Patient states he is feeling much better and stronger. He is able to sit up without problems and walked yesterday in the hallway with assistance of rollator. His numbness and tingling are much improved. His right hand feels like it is asleep sometimes, but this comes and goes and only last a few minutes. He continues to feel numb in his feet and up to right knee and up to left tibia, but this is chronic from DM neuropathy. His back pain has completely resolved. He is asking about rehab and being able to "get in somewhere" and NP reassured him that therapy will make recommendations and social work finds the facility.    O: Current vital signs: BP (!) 91/57 (BP Location: Right Arm)   Pulse 71   Temp 98 F (36.7 C) (Oral)   Resp 18   Ht 6\' 1"  (1.854 m)   Wt (!) 188.8 kg   SpO2 97%   BMI 54.91 kg/m  Vital signs in last 24 hours: Temp:  [97.7 F (36.5 C)-99.8 F (37.7 C)] 98 F (36.7 C) (08/01 0544) Pulse Rate:  [71-103] 71 (08/01 0544) Resp:  [18] 18 (08/01 0544) BP: (90-110)/(43-68) 91/57 (08/01 0544) SpO2:  [88 %-100 %] 97 % (08/01 0544)  GENERAL: Morbidly obese, very well appearing male. Awake, alert in NAD. Sitting on the side of his bed.  HEENT: Normocephalic and atraumatic. LUNGS: Normal respiratory effort.  CV: RRR.  Ext: warm.   NEURO:  Mental Status: Alert and oriented x 3.  Speech/Language: speech is without aphasia or dysarthria.  Naming, repetition, fluency, and comprehension intact.  Cranial Nerves:  II: PERRL. Visual fields full.  III, IV, VI: EOMI. Eyelids elevate symmetrically.  V: Sensation is intact to light touch and symmetrical to face.  VII: Smile is symmetrical. Able to puff cheeks and raise eyebrows.  VIII: hearing intact to voice. IX, X: Palate elevates symmetrically. Phonation is normal.  03-25-1975 shrug 5/5. XII: tongue is midline without fasciculations.  Motor:  Muscle bulk: normal, tone normal, pronator drift  none Mvmt Root Nerve  Muscle Right Left Comments  SA C5/6 Ax Deltoid     EF C5/6 Mc Biceps 5 5   EE C6/7/8 Rad Triceps 4+ 4+   WF C6/7 Med FCR     WE C7/8 PIN ECU     F Ab C8/T1 U ADM/FDI 4+ 4+   HF L1/2/3 Fem Illopsoas 4- 4-   KE L2/3/4 Fem Quad 5 5   DF L4/5 D Peron Tib Ant 5 5   PF S1/2 Tibial Grc/Sol 4+ 4+    Sensation- Intact to light touch bilaterally. Decreased in LLE up to tibia and RLE up to knee.  Extinction absent to DSS. He is able to sense light touch all the way to quadriceps which has improved over NP last exam. He has no spinal level for sensation.  Coordination: FTN intact bilaterally. No drift.  DTRs:  RUE:  brachioradialis 2       biceps 2 RLE:  patella   0   LUE:  brachioradialis 2   biceps 2 LLE:  patella 0 Gait-Patient sat up from lying position and stood from sitting position without assistance.   Medications  Current Facility-Administered Medications:    0.9 %  sodium chloride infusion, , Intravenous, PRN, 03-25-1999 T, MD, Last Rate: 10 mL/hr at 02/04/21 0540, 250 mL at 02/04/21 0540   0.9 %  sodium chloride infusion, , Intravenous, Continuous, 02/06/21, MD  acetaminophen (TYLENOL) tablet 650 mg, 650 mg, Oral, Q6H PRN **OR** acetaminophen (TYLENOL) suppository 650 mg, 650 mg, Rectal, Q6H PRN, Emeline General, MD   albuterol (PROVENTIL) (2.5 MG/3ML) 0.083% nebulizer solution 2.5 mg, 2.5 mg, Nebulization, Q6H PRN, Cathie Hoops, RPH, 2.5 mg at 02/05/21 9563   cholecalciferol (VITAMIN D3) tablet 1,000 Units, 1,000 Units, Oral, Daily, Burnadette Pop, MD, 1,000 Units at 02/05/21 0955   diclofenac Sodium (VOLTAREN) 1 % topical gel 2 g, 2 g, Topical, QID, Chipper Herb, Ping T, MD, 2 g at 02/05/21 2129   enoxaparin (LOVENOX) injection 95 mg, 0.5 mg/kg, Subcutaneous, Q24H, Lodema Hong A, RPH, 95 mg at 02/05/21 0955   Immune Globulin 10% (PRIVIGEN) IV infusion 50 g, 400 mg/kg (Adjusted), Intravenous, Q24 Hr x 5, Kirby-Graham, Beather Arbour, NP, Stopped at 02/05/21  2232   insulin aspart (novoLOG) injection 0-15 Units, 0-15 Units, Subcutaneous, TID WC, Emeline General, MD, 3 Units at 02/06/21 0817   linagliptin (TRADJENTA) tablet 5 mg, 5 mg, Oral, Daily, Mikey College T, MD, 5 mg at 02/05/21 8756   multivitamin with minerals tablet 1 tablet, 1 tablet, Oral, Daily, Kirby-Graham, Beather Arbour, NP, 1 tablet at 02/05/21 0955   polyethylene glycol (MIRALAX / GLYCOLAX) packet 17 g, 17 g, Oral, Daily, Mikey College T, MD, 17 g at 02/05/21 0956   pregabalin (LYRICA) capsule 300 mg, 300 mg, Oral, BID, Mikey College T, MD, 300 mg at 02/05/21 2128   simvastatin (ZOCOR) tablet 20 mg, 20 mg, Oral, QHS, Mikey College T, MD, 20 mg at 02/05/21 2128   tamsulosin (FLOMAX) capsule 0.4 mg, 0.4 mg, Oral, Daily, Mikey College T, MD, 0.4 mg at 02/05/21 0954   [EXPIRED] thiamine 500mg  in normal saline (76ml) IVPB, 500 mg, Intravenous, Q8H, Last Rate: 100 mL/hr at 02/05/21 0622, 500 mg at 02/05/21 0622 **FOLLOWED BY** thiamine (B-1) 250 mg in sodium chloride 0.9 % 50 mL IVPB, 250 mg, Intravenous, Daily, Last Rate: 100 mL/hr at 02/05/21 1502, 250 mg at 02/05/21 1502 **FOLLOWED BY** [START ON 02/11/2021] thiamine (B-1) injection 100 mg, 100 mg, Intravenous, Daily, Kirby-Graham, 04/13/2021, NP   topiramate (TOPAMAX) tablet 100 mg, 100 mg, Oral, BID, Beather Arbour T, MD, 100 mg at 02/05/21 2129   traZODone (DESYREL) tablet 150 mg, 150 mg, Oral, QHS, 2130 T, MD, 150 mg at 02/05/21 2128   triamcinolone cream (KENALOG) 0.1 % cream 1 application, 1 application, Topical, BID, 2129 T, MD, 1 application at 02/05/21 2129   ursodiol (ACTIGALL) capsule 300 mg, 300 mg, Oral, TID, 2130, RPH, 300 mg at 02/05/21 2128  Pertinent Labs B12 over 1000.   TSH normal.   No new Imaging  Assessment: 55 yo male who has improved greatly since NP exam 3 days ago. We were unable to get MRI imaging of spine and LP due to body habitus, and began IVIG empiric treatment on Friday. He has made progress in his  strength to LEs and with numbness and tingling to all extremities. He is able to stand now which is encouraging. The IVIG seems to be making a difference as well as Thiamine supplementation.   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 -Nutritional deficits which were likely playing a role with presentation.   Recommendations/Plan:  -Continue IVIG, Today is dose # 4.  -Continue PT/OT. CIR vs SNF based on PT/Ot eval. -Continue Thiamine IV as ordered now, then Thiamine 100mg  po qd and daily on discharge.  -Continue MVI. Continue on discharge.  -Continue Vit D3  until level is normal.  -Start Oscal + D as ordered and continue on discharge.  - Follow up with neurology outpatient.  NEUROHOSPITALIST ADDENDUM Performed a face to face diagnostic evaluation.   I have reviewed the contents of history and physical exam as documented by PA/ARNP/Resident and have made changes to the note as needed. I agree with above documentation. I have discussed and formulated the above plan as documented. Edits to the note have been made as needed.  Erick Blinks, MD [facility] [phone]   If 7pm to 7am, please call on call as listed on AMION.

## 2021-02-06 NOTE — Plan of Care (Signed)
  Problem: Education: Goal: Knowledge of General Education information will improve Description: Including pain rating scale, medication(s)/side effects and non-pharmacologic comfort measures Outcome: Progressing   Problem: Activity: Goal: Risk for activity intolerance will decrease Outcome: Progressing   Problem: Elimination: Goal: Will not experience complications related to bowel motility Outcome: Progressing Goal: Will not experience complications related to urinary retention Outcome: Progressing   Problem: Safety: Goal: Ability to remain free from injury will improve Outcome: Progressing   

## 2021-02-07 LAB — BASIC METABOLIC PANEL
Anion gap: 7 (ref 5–15)
BUN: 20 mg/dL (ref 6–20)
CO2: 22 mmol/L (ref 22–32)
Calcium: 8.8 mg/dL — ABNORMAL LOW (ref 8.9–10.3)
Chloride: 106 mmol/L (ref 98–111)
Creatinine, Ser: 1.06 mg/dL (ref 0.61–1.24)
GFR, Estimated: >60 mL/min (ref >60)
Glucose, Bld: 136 mg/dL — ABNORMAL HIGH (ref 70–99)
Potassium: 4.1 mmol/L (ref 3.5–5.1)
Sodium: 135 mmol/L (ref 135–145)

## 2021-02-07 LAB — GLUCOSE, CAPILLARY
Glucose-Capillary: 123 mg/dL — ABNORMAL HIGH (ref 70–99)
Glucose-Capillary: 151 mg/dL — ABNORMAL HIGH (ref 70–99)
Glucose-Capillary: 189 mg/dL — ABNORMAL HIGH (ref 70–99)
Glucose-Capillary: 134 mg/dL — ABNORMAL HIGH (ref 70–99)

## 2021-02-07 NOTE — Progress Notes (Signed)
Inpatient Rehab Admissions Coordinator:   Consult received and chart reviewed.  Note pt worked with PT this afternoon and current recommendations are for outpatient therapy.  As pt mobilizing up to 150' with only guarding assist, would not qualify for CIR.  Outpatient recs are appropriate.  CIR will sign of at this time.    Estill Dooms, PT, DPT Admissions Coordinator [phone] 02/07/21  1:36 PM

## 2021-02-07 NOTE — Progress Notes (Signed)
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  He was recently admitted and was discharged from here on 7/17 after he was managed for near syncopal episode/hypotension/AKI.  He uses cocaine and his last cocaine use was few days ago.  Patient was admitted for the management of bilateral lower extremity weakness of unknown etiology.  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  PT consulted and recommended skilled nursing facility/HH on discharge.he is currently residing in a shelter/[facility] and he is not eligible for PT/OT creating problem with disposition.  TOC consulted and following.  Assessment & Plan:   Active Problems:   Decreased ambulation status   Impaired ambulation  Bilateral lower extremity weakness: Sudden onset.  He is usually ambulatory at baseline with the help of walker.   He also complains of numbness, tingling in his bilateral foot which cud be from diabetic polyneuropathy. LP was attempted in the emergency department but there was no needle that there was long enough to reach his spine so LP canceled.  Could not go for MRI because of super morbid obesity.  Patient was started on IVIG ,day 5/5,empirically for the treatment of AIDP/Guillain-Barr syndrome.  His last dose is later this evening. Vitamin B12 level normal.  Also started on high-dose thiamine. Lumbar spine CT showed degenerative changes, foraminal stenosis.  No acute findings Patient reports improvement in the weakness of his bilateral lower extremities . Neurology  closely following.  He needs outpatient follow-up.  Type 2 diabetes with hyperglycemia: Last hemoglobin A1c of 7.5 .Takes metformin ,glipizide ,trajenta at home, recently started on Trulicity.  Continue current insulin regimen.  Monitor blood sugars We might discontinue glipizide on discharge.  Diabetic neuropathy: Secondary to poorly controlled diabetes.  On Lyrica, dose increased  History of hypertension: He was on  lisinopril.Blood pressure is soft so we will discontinue lisinopril.    AKI: Resolved now..  Lisinopril  held.  IV fluids stopped  Substance abuse: Last cocaine intake was few days ago.  Continues to take cocaine despite multiple counseling.  Also abuses of alcohol.  Currently not in withdrawal.  He has also been started on high-dose thiamine IV.  Morbid obesity: BMI of 54.9  History of hyperlipidemia: On Zocor  History of BPH: On Flomax  Vitamin D deficiency: Started on supplementation  Deconditioning/disposition: Patient's lower extremity weakness is improving.Marland KitchenPT/OT recommended short term skilled nursing facility on discharge Currently lives in a shelter.  TOC consulted.  Due to his insurance, he will not get a skilled nursing facility previlege or home health .  This is creating difficulty in disposition.  PT/OT following          DVT prophylaxis:Lovenox Code Status: Full Family Communication: called brother Thayer Ohm twice on phone, phone not received Status is: Inpatient  Dispo: The patient is from: Shelter              Anticipated d/c is to: SNF vs Shelter              Patient currently is not medically stable to d/c.   Difficult to place patient No Patient is technically medically stable for discharge after completion  of last dose of IVIG tonight but needs skilled nursing facility or home health at least for discharge.  Home health/skilled nursing facility not possible due to his insurance.TOC aware    Consultants:  Neurology  Procedures:None  Antimicrobials:  Anti-infectives (From admission, onward)    None       Subjective:  Patient seen and examined the bedside this morning.  Hemodynamically stable during my evaluation.  He was having issues with nutrition, other nursing issues so he was not in a good mood today.  He says he still has weakness in his bilateral lower extremities but is improving.  Denies any new complaints today  60 objective: Vitals:   02/06/21 2309 02/06/21 2354 02/07/21 0422 02/07/21 0908  BP: (!) 120/55 121/74 133/69 (!) 106/49  Pulse: 62 75 93 72  Resp: 18 18 19 17   Temp: (!) 97.5 F (36.4 C) 97.9 F (36.6 C) 97.7 F (36.5 C) 98.2 F (36.8 C)  TempSrc: Oral Oral Oral   SpO2: 97% 97% 100% 95%  Weight:      Height:        Intake/Output Summary (Last 24 hours) at 02/07/2021 1142 Last data filed at 02/07/2021 0800 Gross per 24 hour  Intake 2848.03 ml  Output 1900 ml  Net 948.03 ml   Filed Weights   02/03/21 1657 02/03/21 1746  Weight: (!) 188.8 kg (!) 188.8 kg    Examination:   General exam: Overall comfortable, not in distress, morbidly obese HEENT: PERRL Respiratory system:  no wheezes or crackles  Cardiovascular system: S1 & S2 heard, RRR.  Gastrointestinal system: Abdomen is nondistended, soft and nontender. Central nervous system: Alert and oriented Weakness in bilateral lower extremities with power of 4-4+/5 on bilateral lower extremities Extremities: No edema, no clubbing ,no cyanosis Skin: No rashes, no ulcers,no icterus     Data Reviewed: I have personally reviewed following labs and imaging studies  CBC: Recent Labs  Lab 02/02/21 1731 02/05/21 0209  WBC 7.8 6.7  NEUTROABS 3.8 3.1  HGB 11.4* 10.2*  HCT 35.0* 32.2*  MCV 102.3* 104.2*  PLT 334 287   Basic Metabolic Panel: Recent Labs  Lab 02/02/21 1731 02/03/21 1339 02/05/21 0209 02/06/21 0140 02/07/21 0538  NA 133*  --  134* 134* 135  K 4.2  --  4.5 4.4 4.1  CL 104  --   104 107 106  CO2 19*  --  24 22 22   GLUCOSE 176*  --  113* 129* 136*  BUN 12  --  19 24* 20  CREATININE 1.12  --  1.39* 1.44* 1.06  CALCIUM 9.0  --  8.7* 8.5* 8.8*  MG  --  1.7  --   --   --    GFR: Estimated Creatinine Clearance: 137.5 mL/min (by C-G formula based on SCr of 1.06 mg/dL). Liver Function Tests: Recent Labs  Lab 02/02/21 1731  AST 40  ALT 75*  ALKPHOS 52  BILITOT 0.5  PROT 7.0  ALBUMIN 3.4*   No results for input(s): LIPASE, AMYLASE in the last 168 hours. No results for input(s): AMMONIA in the last 168 hours. Coagulation Profile: No results for input(s): INR, PROTIME in the last 168 hours. Cardiac Enzymes: No results for input(s): CKTOTAL, CKMB, CKMBINDEX, TROPONINI in the last 168 hours. BNP (last 3 results) No results for input(s): PROBNP in the last 8760 hours. HbA1C: No results for input(s): HGBA1C in the last 72 hours.  CBG: Recent Labs  Lab 02/06/21 1204 02/06/21 1707 02/06/21 2125  02/07/21 0650 02/07/21 1123  GLUCAP 183* 121* 139* 123* 151*   Lipid Profile: No results for input(s): CHOL, HDL, LDLCALC, TRIG, CHOLHDL, LDLDIRECT in the last 72 hours. Thyroid Function Tests: No results for input(s): TSH, T4TOTAL, FREET4, T3FREE, THYROIDAB in the last 72 hours.  Anemia Panel: No results for input(s): VITAMINB12, FOLATE, FERRITIN, TIBC, IRON, RETICCTPCT in the last 72 hours.  Sepsis Labs: No results for input(s): PROCALCITON, LATICACIDVEN in the last 168 hours.  Recent Results (from the past 240 hour(s))  Resp Panel by RT-PCR (Flu A&B, Covid) Nasopharyngeal Swab     Status: None   Collection Time: 02/03/21 10:30 AM   Specimen: Nasopharyngeal Swab; Nasopharyngeal(NP) swabs in vial transport medium  Result Value Ref Range Status   SARS Coronavirus 2 by RT PCR NEGATIVE NEGATIVE Final    Comment: (NOTE) SARS-CoV-2 target nucleic acids are NOT DETECTED.  The SARS-CoV-2 RNA is generally detectable in upper respiratory specimens during the acute  phase of infection. The lowest concentration of SARS-CoV-2 viral copies this assay can detect is 138 copies/mL. A negative result does not preclude SARS-Cov-2 infection and should not be used as the sole basis for treatment or other patient management decisions. A negative result may occur with  improper specimen collection/handling, submission of specimen other than nasopharyngeal swab, presence of viral mutation(s) within the areas targeted by this assay, and inadequate number of viral copies(<138 copies/mL). A negative result must be combined with clinical observations, patient history, and epidemiological information. The expected result is Negative.  Fact Sheet for Patients:  BloggerCourse.com  Fact Sheet for Healthcare Providers:  SeriousBroker.it  This test is no t yet approved or cleared by the [place] FDA and  has been authorized for detection and/or diagnosis of SARS-CoV-2 by FDA under an Emergency Use Authorization (EUA). This EUA will remain  in effect (meaning this test can be used) for the duration of the COVID-19 declaration under Section 564(b)(1) of the Act, 21 U.S.C.section 360bbb-3(b)(1), unless the authorization is terminated  or revoked sooner.       Influenza A by PCR NEGATIVE NEGATIVE Final   Influenza B by PCR NEGATIVE NEGATIVE Final    Comment: (NOTE) The Xpert Xpress SARS-CoV-2/FLU/RSV plus assay is intended as an aid in the diagnosis of influenza from Nasopharyngeal swab specimens and should not be used as a sole basis for treatment. Nasal washings and aspirates are unacceptable for Xpert Xpress SARS-CoV-2/FLU/RSV testing.  Fact Sheet for Patients: BloggerCourse.com  Fact Sheet for Healthcare Providers: SeriousBroker.it  This test is not yet approved or cleared by the [place] FDA and has been authorized for detection and/or diagnosis of  SARS-CoV-2 by FDA under an Emergency Use Authorization (EUA). This EUA will remain in effect (meaning this test can be used) for the duration of the COVID-19 declaration under Section 564(b)(1) of the Act, 21 U.S.C. section 360bbb-3(b)(1), unless the authorization is terminated or revoked.  Performed at [facility], [address] [place], [place] [address]          Radiology Studies: No results found.      Scheduled Meds:  calcium-vitamin D  1 tablet Oral BID   cholecalciferol  1,000 Units Oral Daily   diclofenac Sodium  2 g Topical QID   enoxaparin (LOVENOX) injection  0.5 mg/kg Subcutaneous Q24H   insulin aspart  0-15 Units Subcutaneous TID WC   linagliptin  5 mg Oral Daily   multivitamin with minerals  1 tablet Oral Daily   polyethylene glycol  17 g Oral Daily   pregabalin  300 mg Oral BID   simvastatin  20 mg Oral QHS   tamsulosin  0.4 mg Oral Daily   [START ON 02/11/2021] thiamine injection  100 mg Intravenous Daily   topiramate  100 mg Oral BID   traZODone  150 mg Oral QHS   triamcinolone cream  1 application Topical BID   ursodiol  300 mg Oral TID   Continuous Infusions:  sodium chloride 250 mL (02/04/21 0540)   sodium chloride 100 mL/hr at 02/07/21 0000   Immune Globulin 10% 50 g (02/06/21 2120)   thiamine injection 250 mg (02/07/21 0936)     LOS: 2 days    Time spent:35 mins. More than 50% of that time was spent in counseling and/or coordination of care.      Burnadette Pop, MD [facility] P8/08/2020, 11:42 AM

## 2021-02-07 NOTE — Progress Notes (Signed)
PT Cancellation Note  Patient Details Name: Jeremy Bauer MRN: [id number] DOB: 1966-02-09   Cancelled Treatment:    Reason Eval/Treat Not Completed: Patient declined, no reason specified. States he does not feel up to it this morning. Will return later as time allows.    Khloi Rawl 02/07/2021, 11:05 AM

## 2021-02-07 NOTE — Progress Notes (Signed)
Occupational Therapy Treatment Patient Details Name: Jeremy Bauer MRN: [id number] DOB: [birth year] Today's Date: 02/07/2021    History of present illness The pt is a 55 yo male presenting 7/15 with c/o blurred vision and hypotension. PMH includes: morbid obesity s/p laparoscopic sleeve gastrectomy, DM II, polyneuropathy, BPH, HTN, OSA, and polysubstance abuse.   OT comments  Pt limited by decreased activity tolerance and decreased ability to care for self. Pt set-upA to modA for ADL tasks and supervisionA for rollator. Pt able to reach BLEs with figure 4 technique and issued LH sponge and reacher. pt reports that she feels more confident about going to shelter after mobility and with AE. Pt does not require continued OT skilled services. Pt appears safe to d/c to shelter. OT following acutely.   Follow Up Recommendations  Other (comment) (aquatic therapy)    Equipment Recommendations  None recommended by OT    Recommendations for Other Services      Precautions / Restrictions Precautions Precautions: Fall Restrictions Weight Bearing Restrictions: No       Mobility Bed Mobility Overal bed mobility: Independent             General bed mobility comments: supine to sitting EOB    Transfers Overall transfer level: Needs assistance Equipment used: 4-wheeled walker Transfers: Sit to/from Stand Sit to Stand: Supervision         General transfer comment: supervisionA sit to stand from bed and rollator. poor eccentric control    Balance Overall balance assessment: Mild deficits observed, not formally tested                                         ADL either performed or assessed with clinical judgement   ADL Overall ADL's : Needs assistance/impaired     Grooming: Modified independent;Sitting               Lower Body Dressing: Supervision/safety;Cueing for safety;Sitting/lateral leans;Sit to/from stand Lower Body Dressing Details (indicate cue  type and reason): Pt able to reach BLEs with figure 4 technique and issued LH sponge and reacher. pt reports that she feels more confident about going to shelter after mobility and with AE.             Functional mobility during ADLs: Supervision/safety;Rolling walker General ADL Comments: Pt issued AE to ensure safety and increased ability to care for self.     Vision       Perception     Praxis      Cognition Arousal/Alertness: Awake/alert Behavior During Therapy: WFL for tasks assessed/performed Overall Cognitive Status: Within Functional Limits for tasks assessed                                 General Comments: Pt appears to be more insightful of deficits. Pt wanting a blender bottle for healthy shakes as he wants to lose weight to be healthier. pt able to stay on task today.        Exercises     Shoulder Instructions       General Comments      Pertinent Vitals/ Pain       Pain Assessment: Faces Faces Pain Scale: No hurt Pain Location: Patient reports legs feel tired with ambulation Pain Descriptors / Indicators: Discomfort Pain Intervention(s): Monitored during session;Repositioned  Home Living  Prior Functioning/Environment              Frequency  Min 2X/week        Progress Toward Goals  OT Goals(current goals can now be found in the care plan section)  Progress towards OT goals: Progressing toward goals  Acute Rehab OT Goals Patient Stated Goal: be able to bathe/dress with AE OT Goal Formulation: With patient Time For Goal Achievement: 02/20/21 Potential to Achieve Goals: Good  Plan Discharge plan needs to be updated    Co-evaluation                 AM-PAC OT "6 Clicks" Daily Activity     Outcome Measure   Help from another person eating meals?: None Help from another person taking care of personal grooming?: None Help from another person toileting,  which includes using toliet, bedpan, or urinal?: None Help from another person bathing (including washing, rinsing, drying)?: None Help from another person to put on and taking off regular upper body clothing?: None Help from another person to put on and taking off regular lower body clothing?: None 6 Click Score: 24    End of Session Equipment Utilized During Treatment: Other (comment) (rollator)  OT Visit Diagnosis: Muscle weakness (generalized) (M62.81)   Activity Tolerance Patient tolerated treatment well   Patient Left Other (comment)   Nurse Communication Mobility status        Time: 2585-2778 OT Time Calculation (min): 14 min  Charges: OT General Charges $OT Visit: 1 Visit OT Treatments $Self Care/Home Management : 8-22 mins  Flora Lipps, OTR/L Acute Rehabilitation Services Pager: [phone] Office: [phone]    Kassey Laforest  C 02/07/2021, 4:35 PM

## 2021-02-07 NOTE — Plan of Care (Signed)
  Problem: Education: Goal: Knowledge of General Education information will improve Description Including pain rating scale, medication(s)/side effects and non-pharmacologic comfort measures Outcome: Progressing   Problem: Health Behavior/Discharge Planning: Goal: Ability to manage health-related needs will improve Outcome: Progressing   

## 2021-02-07 NOTE — Progress Notes (Signed)
Physical Therapy Treatment Patient Details Name: Jeremy Bauer MRN: [id number] DOB: 07-26-1965 Today's Date: 02/07/2021    History of Present Illness The pt is a 55 yo male presenting 7/15 with c/o blurred vision and hypotension. PMH includes: morbid obesity s/p laparoscopic sleeve gastrectomy, DM II, polyneuropathy, BPH, HTN, OSA, and polysubstance abuse.    PT Comments    Patient agrees to PT session. Says he was in a bad mood this morning. He requires supervision for sit to stand from elevated bed. Ambulates with four wheeled walker with min guard. Has good awareness about limitations due to fatigue and sob and will rest as needed. Patient will continue to benefit from skilled PT while here to improve mobility and activity tolerance.     Follow Up Recommendations  Outpatient PT     Equipment Recommendations  None recommended by PT    Recommendations for Other Services       Precautions / Restrictions Precautions Precautions: Fall Restrictions Weight Bearing Restrictions: No    Mobility  Bed Mobility Overal bed mobility: Independent                  Transfers Overall transfer level: Needs assistance Equipment used: 4-wheeled walker Transfers: Sit to/from Stand Sit to Stand: Supervision         General transfer comment: supervisionA sit to stand from bed and rollator. poor eccentric control  Ambulation/Gait Ambulation/Gait assistance: Min guard Gait Distance (Feet): 150 Feet Assistive device: 4-wheeled walker Gait Pattern/deviations: Step-through pattern;Wide base of support Gait velocity: decreased   General Gait Details: Patient able to walk at least 150 feet total with multiple seated rest breaks due to fatigue and sob.   Stairs             Wheelchair Mobility    Modified Rankin (Stroke Patients Only)       Balance Overall balance assessment: Mild deficits observed, not formally tested                                           Cognition Arousal/Alertness: Awake/alert Behavior During Therapy: WFL for tasks assessed/performed Overall Cognitive Status: Within Functional Limits for tasks assessed                                 General Comments: Pt tangential with decreased insight to deficits. Poor medical understanding, but likely baseline. pt requires redirection- hyperverbose.      Exercises      General Comments        Pertinent Vitals/Pain Pain Assessment: Faces Faces Pain Scale: Hurts a little bit Pain Location: Patient reports legs feel tired with ambulation Pain Descriptors / Indicators: Cramping;Discomfort Pain Intervention(s): Monitored during session    Home Living                      Prior Function            PT Goals (current goals can now be found in the care plan section) Acute Rehab PT Goals Patient Stated Goal: start aquatic exercise program PT Goal Formulation: With patient Time For Goal Achievement: 02/20/21 Potential to Achieve Goals: Good Progress towards PT goals: Progressing toward goals    Frequency    Min 3X/week      PT Plan Current plan remains appropriate  Co-evaluation              AM-PAC PT "6 Clicks" Mobility   Outcome Measure  Help needed turning from your back to your side while in a flat bed without using bedrails?: None Help needed moving from lying on your back to sitting on the side of a flat bed without using bedrails?: None Help needed moving to and from a bed to a chair (including a wheelchair)?: A Little Help needed standing up from a chair using your arms (e.g., wheelchair or bedside chair)?: A Little Help needed to walk in hospital room?: A Little Help needed climbing 3-5 steps with a railing? : A Lot 6 Click Score: 19    End of Session   Activity Tolerance: Patient limited by fatigue Patient left: in bed;with call bell/phone within reach;with nursing/sitter in room Nurse Communication: Mobility  status PT Visit Diagnosis: Muscle weakness (generalized) (M62.81);Difficulty in walking, not elsewhere classified (R26.2)     Time: 2409-7353 PT Time Calculation (min) (ACUTE ONLY): 32 min  Charges:  $Gait Training: 23-37 mins                    Mickey Esguerra, PT, GCS 02/07/21,1:28 PM

## 2021-02-07 NOTE — TOC Progression Note (Signed)
Transition of Care [facility]) - Progression Note    Patient Details  Name: Jeremy Bauer MRN: [id number] Date of Birth: 1965-11-18  Transition of Care [facility]) CM/SW Contact  Okey Dupre Lazaro Arms, LCSW Phone Number: 02/07/2021, 5:24 PM  Clinical Narrative:  Talked with patient at the bedside (5:06 pm)  regarding need for housing and currently staying at [facility]. Also discussed nursing home placement and the probability that he is not eligible for a nursing home bed primarily due to his lack housing. Patient talked with CSW regarding his prior placement in a SNF and his belief that due his obesity and current health issues, he would meet criteria for LTC placement. Patient advised that contact will be made with SNF's tomorrow to determine if he could be considered for LTC placement. [name] mentioned interest in a rooming house wand was provided with rooming house lists.       Expected Discharge Plan: Home/Self Care Barriers to Discharge: Continued Medical Work up  Expected Discharge Plan and Services Expected Discharge Plan: Home/Self Care In-house Referral: Clinical Social Work Discharge Planning Services: NA Post Acute Care Choice: Skilled Nursing Facility Living arrangements for the past 2 months: Homeless Shelter                                       Social Determinants of Health (SDOH) Interventions  Patient is homeless  Readmission Risk Interventions No flowsheet data found.

## 2021-02-07 NOTE — Plan of Care (Signed)

## 2021-02-07 NOTE — TOC Progression Note (Signed)
Transition of Care [facility]) - Progression Note    Patient Details  Name: Jeremy Bauer MRN: [id number] Date of Birth: 1965-09-27  Transition of Care [facility]) CM/SW Contact  Carles Collet, RN Phone Number: 02/07/2021, 3:09 PM  Clinical Narrative:   Met with patient at bedside to discuss disposition.  Explained to patient that with +cocaine on 7/29, PT recs for outpatient level of services, and payor source he is not eligible for SNF. Patient has bari rollator from home with him.  Please ensure rollator is sent home with him.  Patient is from [place] and will need verification that he can return and transportation via taxi/ cone transportation home.     Expected Discharge Plan: Home/Self Care Barriers to Discharge: Continued Medical Work up  Expected Discharge Plan and Services Expected Discharge Plan: Home/Self Care In-house Referral: Clinical Social Work Discharge Planning Services: NA Post Acute Care Choice: [facility] Living arrangements for the past 2 months: Homeless Shelter                                       Social Determinants of Health (SDOH) Interventions    Readmission Risk Interventions No flowsheet data found.

## 2021-02-07 NOTE — Progress Notes (Addendum)
Neurology Progress Note  S: Patient states he feels well, but put off PT this am because he was tired from being up most of the night. Stressed to patient and spoke to PT about going back to see him today. He is concerned about leaving the hospital with no rehab plan. The only numbness/tingling he has now is in his feet which is chronic from DM neuropathy.   Last note from PT recommended short term rehab, but patient can not go for this due to MCD. CIR is not possible because CIR will not discharge a patient to a shelter after rehab. CIR told NP about something called the "star" program to increase frequency of rehab in the acute setting. NP has contacted hospitalists and SW about this possibility. NP concerned that he may just bounce back to hospital as PT note says he can only walk 30 ft with moderate assistance.   O: Current vital signs: BP (!) 106/49 (BP Location: Left Arm)   Pulse 72   Temp 98.2 F (36.8 C)   Resp 17   Ht 6\' 1"  (1.854 m)   Wt (!) 188.8 kg   SpO2 95%   BMI 54.91 kg/m  Vital signs in last 24 hours: Temp:  [97.5 F (36.4 C)-98.3 F (36.8 C)] 98.2 F (36.8 C) (08/02 0908) Pulse Rate:  [55-93] 72 (08/02 0908) Resp:  [16-19] 17 (08/02 0908) BP: (106-134)/(49-101) 106/49 (08/02 0908) SpO2:  [95 %-100 %] 95 % (08/02 0908)  GENERAL: Well appearing  morbidly obese male. Awake, alert in NAD. HEENT: Normocephalic and atraumatic. LUNGS: Normal respiratory effort.  CV: RRR . Ext: warm. Psych: light but concerned.   NEURO:  Mental Status: Alert and oriented. Follows commands.  Speech/Language: speech is without aphasia or dysarthria.  Comprehension intact.  Cranial Nerves:  Sensation to light touch to all 4 extremities with some decrease to feet up to lower tibia (chronic). Motor exam shows purposeful movements. Strength 5/5 throughout. DTRs absent to patellas.   Medications  Current Facility-Administered Medications:    0.9 %  sodium chloride infusion, ,  Intravenous, PRN, 04-[birth year]-1977, MD, Last Rate: 10 mL/hr at 02/04/21 0540, 250 mL at 02/04/21 0540   0.9 %  sodium chloride infusion, , Intravenous, Continuous, Adhikari, Amrit, MD, Last Rate: 100 mL/hr at 02/07/21 0000, New Bag at 02/07/21 0000   acetaminophen (TYLENOL) tablet 650 mg, 650 mg, Oral, Q6H PRN **OR** acetaminophen (TYLENOL) suppository 650 mg, 650 mg, Rectal, Q6H PRN, 04/09/21 T, MD   albuterol (PROVENTIL) (2.5 MG/3ML) 0.083% nebulizer solution 2.5 mg, 2.5 mg, Nebulization, Q6H PRN, Mikey College, RPH, 2.5 mg at 02/05/21 1917   calcium-vitamin D (OSCAL WITH D) 500-200 MG-UNIT per tablet 1 tablet, 1 tablet, Oral, BID, Kirby-Graham, 02/07/21, NP, 1 tablet at 02/07/21 04/09/21   cholecalciferol (VITAMIN D3) tablet 1,000 Units, 1,000 Units, Oral, Daily, Adhikari, Amrit, MD, 1,000 Units at 02/07/21 0931   diclofenac Sodium (VOLTAREN) 1 % topical gel 2 g, 2 g, Topical, QID, Zhang, Ping T, MD, 2 g at 02/07/21 0933   enoxaparin (LOVENOX) injection 95 mg, 0.5 mg/kg, Subcutaneous, Q24H, 04/09/21 A, RPH, 95 mg at 02/07/21 0933   Immune Globulin 10% (PRIVIGEN) IV infusion 50 g, 400 mg/kg (Adjusted), Intravenous, Q24 Hr x 5, Kirby-Graham, 04/09/21, NP, Last Rate: 56.6 mL/hr at 02/06/21 2120, 50 g at 02/06/21 2120   insulin aspart (novoLOG) injection 0-15 Units, 0-15 Units, Subcutaneous, TID WC, 09-07-1984, MD, 2 Units at 02/07/21 [phone]  linagliptin (TRADJENTA) tablet 5 mg, 5 mg, Oral, Daily, Mikey College T, MD, 5 mg at 02/07/21 0932   multivitamin with minerals tablet 1 tablet, 1 tablet, Oral, Daily, Kirby-Graham, Beather Arbour, NP, 1 tablet at 02/07/21 0931   polyethylene glycol (MIRALAX / GLYCOLAX) packet 17 g, 17 g, Oral, Daily, Mikey College T, MD, 17 g at 02/07/21 0933   pregabalin (LYRICA) capsule 300 mg, 300 mg, Oral, BID, Mikey College T, MD, 300 mg at 02/07/21 0931   simvastatin (ZOCOR) tablet 20 mg, 20 mg, Oral, QHS, Mikey College T, MD, 20 mg at 02/06/21 2255   tamsulosin (FLOMAX) capsule  0.4 mg, 0.4 mg, Oral, Daily, Mikey College T, MD, 0.4 mg at 02/07/21 0931   [EXPIRED] thiamine 500mg  in normal saline (52ml) IVPB, 500 mg, Intravenous, Q8H, Last Rate: 100 mL/hr at 02/05/21 0622, 500 mg at 02/05/21 0622 **FOLLOWED BY** thiamine (B-1) 250 mg in sodium chloride 0.9 % 50 mL IVPB, 250 mg, Intravenous, Daily, Last Rate: 100 mL/hr at 02/07/21 0936, 250 mg at 02/07/21 0936 **FOLLOWED BY** [START ON 02/11/2021] thiamine (B-1) injection 100 mg, 100 mg, Intravenous, Daily, Kirby-Graham, 04/13/2021, NP   topiramate (TOPAMAX) tablet 100 mg, 100 mg, Oral, BID, Beather Arbour T, MD, 100 mg at 02/07/21 0931   traZODone (DESYREL) tablet 150 mg, 150 mg, Oral, QHS, 04/09/21 T, MD, 150 mg at 02/06/21 2255   triamcinolone cream (KENALOG) 0.1 % cream 1 application, 1 application, Topical, BID, 2256 T, MD, 1 application at 02/07/21 0933   ursodiol (ACTIGALL) capsule 300 mg, 300 mg, Oral, TID, 04/09/21, RPH, 300 mg at 02/07/21 0932  No new pertinent labs or tests.   Assessment: 55 yo male who presented 4 days ago with LE weakness, areflexia to LEs, and numbness and tingling. He has improved rapidly which is unusual with GBS. However, he has improved with IVIG. Once his Vitamins for gastrectomy were restarted he has also improved. However, NP is still concerned about him being discharged to shelter with no out patient PT. He is unable to go to short term rehab because he has MCD. He is unable to qualify for CIR. NP told about a "star" program where he may be able to stay here for awhile with increased PT visits. SW is looking into this.   Patient told about plan and we will see what we can find out.   Impression: -Bilateral leg weakness with numbness and tingling. GBS vs Vitamin deficiency s/p IVIG x 5.   Recommendations/Plan:  -f/up with SW about the star program.  -Do not recommend that patient be discharged with out some plan for rehab, although this is affected by his insurance.  -He will  need to stay on Vit B12, MVI, Ca + D, and Thiamine for life.  -Neurology will be available for questions.    Pt seen by 53, MSN, APN-BC/Nurse Practitioner/Neuro  Pager: Jimmye Norman

## 2021-02-08 ENCOUNTER — Encounter: Payer: Self-pay | Admitting: Physician Assistant

## 2021-02-08 ENCOUNTER — Other Ambulatory Visit: Payer: Self-pay | Admitting: Critical Care Medicine

## 2021-02-08 ENCOUNTER — Other Ambulatory Visit: Payer: Self-pay

## 2021-02-08 ENCOUNTER — Other Ambulatory Visit (HOSPITAL_COMMUNITY): Payer: Self-pay

## 2021-02-08 DIAGNOSIS — F1010 Alcohol abuse, uncomplicated: Secondary | ICD-10-CM

## 2021-02-08 DIAGNOSIS — R3911 Hesitancy of micturition: Secondary | ICD-10-CM

## 2021-02-08 DIAGNOSIS — G4733 Obstructive sleep apnea (adult) (pediatric): Secondary | ICD-10-CM

## 2021-02-08 DIAGNOSIS — R262 Difficulty in walking, not elsewhere classified: Secondary | ICD-10-CM

## 2021-02-08 DIAGNOSIS — E1165 Type 2 diabetes mellitus with hyperglycemia: Secondary | ICD-10-CM

## 2021-02-08 DIAGNOSIS — E1142 Type 2 diabetes mellitus with diabetic polyneuropathy: Principal | ICD-10-CM

## 2021-02-08 DIAGNOSIS — N521 Erectile dysfunction due to diseases classified elsewhere: Secondary | ICD-10-CM

## 2021-02-08 DIAGNOSIS — F419 Anxiety disorder, unspecified: Secondary | ICD-10-CM

## 2021-02-08 DIAGNOSIS — Z9884 Bariatric surgery status: Secondary | ICD-10-CM

## 2021-02-08 DIAGNOSIS — E1169 Type 2 diabetes mellitus with other specified complication: Secondary | ICD-10-CM

## 2021-02-08 DIAGNOSIS — M5136 Other intervertebral disc degeneration, lumbar region: Secondary | ICD-10-CM

## 2021-02-08 DIAGNOSIS — N401 Enlarged prostate with lower urinary tract symptoms: Secondary | ICD-10-CM

## 2021-02-08 LAB — GLUCOSE, CAPILLARY: Glucose-Capillary: 168 mg/dL — ABNORMAL HIGH (ref 70–99)

## 2021-02-08 MED ORDER — TRULICITY 0.75 MG/0.5ML ~~LOC~~ SOAJ
0.7500 mg | SUBCUTANEOUS | 4 refills | Status: DC
  Filled 2021-02-08: qty 2, 28d supply, fill #0

## 2021-02-08 MED ORDER — LINAGLIPTIN 5 MG PO TABS
5.0000 mg | ORAL_TABLET | Freq: Every day | ORAL | 0 refills | Status: DC
  Filled 2021-02-08: qty 30, 30d supply, fill #0

## 2021-02-08 NOTE — Progress Notes (Signed)
Cooper Render Heeren to be discharged to [facility] per MD order.   Skin clean, dry and intact without evidence of skin break down, no evidence of skin tears noted. IV catheter discontinued intact. Site without signs and symptoms of complications. Dressing and pressure applied. Pt denies pain at the site currently. No complaints noted.  Patient free of lines, drains, and wounds.   Cab voucher given to the patient.  An After Visit Summary (AVS) was printed and given to the patient. Patient escorted via wheelchair, and discharged home via cab.  Arvilla Meres, RN

## 2021-02-08 NOTE — TOC Transition Note (Signed)
Transition of Care [facility]) - CM/SW Discharge Note   Patient Details  Name: Jeremy Bauer MRN: [id number] Date of Birth: [birth year]  Transition of Care [facility]) CM/SW Contact:  Lawerance Sabal, RN Phone Number: 02/08/2021, 9:57 AM   Clinical Narrative:    Spoke w receptionist at [facility], they are aware that patient will return today.  Meds sent through [facility] so he can go home with them. Provided with cab voucher for transportation at DC.    Final next level of care: Home/Self Care Barriers to Discharge: No Barriers Identified   Patient Goals and CMS Choice Patient states their goals for this hospitalization and ongoing recovery are:: Wants to go to in pt SNF or back to [facility] CMS Medicare.gov Compare Post Acute Care list provided to:: Patient Choice offered to / list presented to : Patient  Discharge Placement                       Discharge Plan and Services In-house Referral: Clinical Social Work Discharge Planning Services: NA Post Acute Care Choice: Skilled Nursing Facility                               Social Determinants of Health (SDOH) Interventions     Readmission Risk Interventions No flowsheet data found.

## 2021-02-08 NOTE — Progress Notes (Signed)
Discharge instructions (including medications) discussed with and copy provided to patient/caregiver 

## 2021-02-08 NOTE — Progress Notes (Signed)
Pt seen by [name].  Hospitalized 07/28-08/09/2020 with severe pain in his legs and back when he tried to stand up.   Pt denies using cocaine, but UDS + Cocaine and he admitted to its use to hospital staff.  He had bilateral LE weakness. Neuro consulted and started IVIG for possible AIDP/Guillain-Barr syndrome. (However, sx are not typical for this)   He still c/o LE weakness and is having some back pain.   138/70,  HR 73, O2 97%  Blood sugars are better. Is on Trulicity, but missed a dose last week, the meds will be here tomorrow.   Lungs are clear, 1+ LE edema. Decreased reflexes generally, most likely from neuropathy.  Pt given Lidocaine patches.   Referral done for P.T., will need to use [facility] for transportation.   Theodore Demark, PA-C 02/08/2021 2:06 PM

## 2021-02-08 NOTE — Plan of Care (Signed)
  Problem: Education: Goal: Knowledge of General Education information will improve Description: Including pain rating scale, medication(s)/side effects and non-pharmacologic comfort measures Outcome: Adequate for Discharge   

## 2021-02-09 ENCOUNTER — Other Ambulatory Visit: Payer: Self-pay

## 2021-02-09 ENCOUNTER — Telehealth: Payer: Self-pay

## 2021-02-09 ENCOUNTER — Other Ambulatory Visit (HOSPITAL_COMMUNITY): Payer: Self-pay

## 2021-02-09 LAB — VITAMIN B1: Vitamin B1 (Thiamine): 105.8 nmol/L (ref 66.5–200.0)

## 2021-02-09 NOTE — Telephone Encounter (Signed)
Transition Care Management Unsuccessful Follow-up Telephone Call  Date of discharge and from where:  02/08/2021, [facility]  Attempts:  1st Attempt  Reason for unsuccessful TCM follow-up call:  Left voice message on # [phone]. Call back requested to this CM. Initial call placed to # [phone] and the recording stated to call the number noted above.  Patient has appointment with Georgian Co, PA @ [facility] on 02/15/2021.

## 2021-02-10 ENCOUNTER — Ambulatory Visit: Payer: Self-pay | Admitting: Podiatry

## 2021-02-10 ENCOUNTER — Telehealth: Payer: Self-pay

## 2021-02-10 NOTE — Telephone Encounter (Signed)
Transition Care Management Unsuccessful Follow-up Telephone Call   Date of discharge and from where:  02/08/2021, [facility]   Attempts:  2nd Attempt   Reason for unsuccessful TCM follow-up call:  unable to leave VM at  # [phone]. Initial call placed to # [phone] and the recording stated to call the number noted above.   Patient has appointment with Georgian Co, PA @ [facility] on 02/15/2021.

## 2021-02-13 ENCOUNTER — Other Ambulatory Visit: Payer: Self-pay

## 2021-02-13 ENCOUNTER — Other Ambulatory Visit: Payer: Self-pay | Admitting: Critical Care Medicine

## 2021-02-13 ENCOUNTER — Ambulatory Visit (INDEPENDENT_AMBULATORY_CARE_PROVIDER_SITE_OTHER): Payer: Medicaid Other | Admitting: Podiatry

## 2021-02-13 ENCOUNTER — Encounter: Payer: Self-pay | Admitting: Podiatry

## 2021-02-13 ENCOUNTER — Telehealth: Payer: Self-pay

## 2021-02-13 DIAGNOSIS — R55 Syncope and collapse: Secondary | ICD-10-CM | POA: Diagnosis not present

## 2021-02-13 DIAGNOSIS — M79675 Pain in left toe(s): Secondary | ICD-10-CM | POA: Diagnosis not present

## 2021-02-13 DIAGNOSIS — B351 Tinea unguium: Secondary | ICD-10-CM | POA: Insufficient documentation

## 2021-02-13 DIAGNOSIS — E1142 Type 2 diabetes mellitus with diabetic polyneuropathy: Secondary | ICD-10-CM | POA: Diagnosis not present

## 2021-02-13 DIAGNOSIS — M79674 Pain in right toe(s): Secondary | ICD-10-CM

## 2021-02-13 MED ORDER — LISINOPRIL 30 MG PO TABS
30.0000 mg | ORAL_TABLET | Freq: Every day | ORAL | 2 refills | Status: DC
  Filled 2021-02-13: qty 90, 90d supply, fill #0
  Filled 2021-03-27: qty 90, 90d supply, fill #1

## 2021-02-13 MED ORDER — METFORMIN HCL 1000 MG PO TABS
1000.0000 mg | ORAL_TABLET | Freq: Two times a day (BID) | ORAL | 1 refills | Status: DC
  Filled 2021-02-13: qty 180, 90d supply, fill #0

## 2021-02-13 NOTE — Telephone Encounter (Signed)
Transition Care Management Follow-up Telephone Call Date of discharge and from where: 02/08/2021, [facility]  How have you been since you were released from the hospital? He said he is doing a lot better.  He thought that he should have been placed in a skilled facility at discharge from the hospital but understands that the SW/CM were not able to find an available bed.  Any questions or concerns? Yes - he explained that he has a lot going on and would like to speak with a behavioral health specialist  explained to him that this CM would ask [name], LCSW to contact him.    Items Reviewed: Did the pt receive and understand the discharge instructions provided? Yes  Medications obtained and verified?  He said that he has all of his medications but he is running out of lisinopril and metformin and needs refills.   Informed him that this CM would notify [name].  Other? No  Any new allergies since your discharge? No  Do you have support at home?  He is currently living at [facility] and Equipment/Supplies: Were home health services ordered? no If so, what is the name of the agency? N/a  Has the agency set up a time to come to the patient's home? not applicable Were any new equipment or medical supplies ordered?  No What is the name of the medical supply agency? N/a Were you able to get the supplies/equipment? not applicable Do you have any questions related to the use of the equipment or supplies? No  Functional Questionnaire: (I = Independent and D = Dependent) ADLs: independent. Has bariatric walker to use with ambulation  Follow up appointments reviewed:  PCP Hospital f/u appt confirmed? Yes - he sees [name] at the [facility] on Wednesdays. The appointment with Fabio Pierce, NP for 02/15/2021 has been cancelled.   Specialist Hospital f/u appt confirmed? Yes  Scheduled to see podiatry  on 02/13/2021  Are transportation arrangements needed?  He uses [employer] and has also been approved for [facility] If their condition worsens, is the pt aware to call PCP or go to the Emergency Dept.? Yes Was the patient provided with contact information for the PCP's office or ED? Yes Was to pt encouraged to call back with questions or concerns? Yes

## 2021-02-14 NOTE — Progress Notes (Signed)
This patient presents to the office with chief complaint of long thick nails and diabetic feet.  This patient  says there  is  no pain and discomfort in their feet.  This patient says there are long thick painful nails.  These nails are painful walking and wearing shoes.  Patient has no history of infection or drainage from both feet.  Patient is unable to  self treat his own nails . This patient presents  to the office today for treatment of the  long nails and a foot evaluation due to history of  diabetes.  General Appearance  Alert, conversant and in no acute stress.  Vascular  Dorsalis pedis and posterior tibial  pulses are palpable  bilaterally.  Capillary return is within normal limits  bilaterally. Temperature is within normal limits  bilaterally.  Neurologic  Senn-Weinstein monofilament wire test diminished bilaterally. Muscle power within normal limits bilaterally.  Nails Thick disfigured discolored nails with subungual debris  from hallux to fifth toes bilaterally. No evidence of bacterial infection or drainage bilaterally.  Orthopedic  No limitations of motion of motion feet .  No crepitus or effusions noted.  No bony pathology or digital deformities noted.  Retrocalcaneal exostosis right foot.  Skin  normotropic skin with no porokeratosis noted bilaterally.  No signs of infections or ulcers noted.     Onychomycosis  Diabetes with no foot complications  IE  Debride nails x 10.  A diabetic foot exam was performed and there is no evidence of any vascular or neurologic pathology.  Measure shoe size for this patient using brannock device. RTC 3 months.   Helane Gunther DPM

## 2021-02-15 ENCOUNTER — Inpatient Hospital Stay: Payer: Medicaid Other | Admitting: Physician Assistant

## 2021-02-15 ENCOUNTER — Other Ambulatory Visit: Payer: Self-pay | Admitting: Critical Care Medicine

## 2021-02-15 ENCOUNTER — Ambulatory Visit: Payer: Medicaid Other | Admitting: Physician Assistant

## 2021-02-15 ENCOUNTER — Encounter: Payer: Self-pay | Admitting: Physician Assistant

## 2021-02-15 ENCOUNTER — Other Ambulatory Visit: Payer: Self-pay

## 2021-02-15 DIAGNOSIS — R29898 Other symptoms and signs involving the musculoskeletal system: Secondary | ICD-10-CM

## 2021-02-15 MED ORDER — CICLOPIROX 8 % EX SOLN
Freq: Every day | CUTANEOUS | 0 refills | Status: DC
  Filled 2021-02-15: qty 6.6, 30d supply, fill #0

## 2021-02-15 NOTE — Telephone Encounter (Signed)
noted 

## 2021-02-15 NOTE — Telephone Encounter (Signed)
Spoke with pt and scheduled appt for 8//24/22 at 8:30.

## 2021-02-15 NOTE — Progress Notes (Signed)
He is doing pretty well. His leg pain is improved. He is moving around a little better.  He saw the Podiatrist and had his toenails clipped. Not a lot else was done. The Podiatrist did not feel anything else was needed.  He has toenail fungus on his L foot. Add Penlac toenail polish 2 x day to the first 2 toes on his L foot. Pt wonders about athletes foot, but the Podiatrist and [name] did not see it.  Lidocaine patches were helpful, he needs more, was given them.  Pt has not heard from Physical Therapy, Myriam Jacobson will f/u in am.  He believes that his weight makes his back worse.   He is interested in Placement, but because he has no stable housing to go to after discharge, they would not accept him.  This will continue to be a problem, but the focus will be on PT to help him get stronger.  108/70, HR 92, CBG 174  Message to be sent to Asanti >> she will call him and do behavioral therapy.   F/u with [name] on 09/12 .  He is going to start on the shakes, will get some of them when he gets his check.  No one has acted on the referral to the [facility]. He will be referred to [facility].   Theodore Demark, PA-C 02/15/2021 2:15 PM

## 2021-02-16 ENCOUNTER — Other Ambulatory Visit: Payer: Self-pay

## 2021-02-20 DIAGNOSIS — F1010 Alcohol abuse, uncomplicated: Secondary | ICD-10-CM

## 2021-02-20 NOTE — Discharge Summary (Signed)
Physician Discharge Summary  Jeremy Bauer BXI:[id number] DOB: 1966/02/14 DOA: 02/02/2021  PCP: Elsie Stain, MD  Admit date: 02/02/2021 Discharge date: 02/20/2021  Admitted From: Jeremy B[facility] house Disposition: [facility] house  Recommendations for Outpatient Follow-up:  Continue control of diabetes  Home Health: None Discharge Condition: Stable CODE STATUS: Full code Diet recommendation: Heart healthy and diabetic Consultations: Neurology Procedures/Studies: Attempt at LP   Discharge Diagnoses:  Principal Problem:   Weakness of both lower extremities/inability to ambulate Active Problems:   Type 2 diabetes mellitus (HCC)   Anxiety   Diabetic peripheral neuropathy associated with type 2 diabetes mellitus (HCC)   Erectile dysfunction associated with type 2 diabetes mellitus (HCC)   Morbid (severe) obesity due to excess calories (HCC)   OSA treated with BiPAP   S/P bariatric surgery   BPH (benign prostatic hyperplasia)   Cocaine abuse ([place])   Alcohol abuse     Brief Summary: Patient is a 55 year old male with 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  He was recently admitted and was discharged from here on 7/17 after he was managed for near syncopal episode/hypotension/AKI.  He uses cocaine and his last cocaine use was few days ago.  Patient was admitted for the management of bilateral lower extremity weakness of unknown etiology.   Hospital Course:  Bilateral lower extremity weakness: The patient presented to the ED with a complaint of acute onset bilateral weakness starting on 7/27, in the evening while sitting on a bench outside of [employer] to wait for the bus - He has chronic peripheral neuropathy with associated numbness to his knees - He is also had gastric bypass but has not  been compliant with his supplements - CT of the lumbar spine revealed spinal stenosis at T11-T12, and was suggestive of lower thoracic myelopathy.  Also noted were severe facet arthropathy at L4-L5 and L5-S1, severe disc degeneration at L2-L3, multilevel moderate and severe lumbar neural foraminal stenosis maximal at L4-5. - A neurology consult was requested.  An LP was attempted however the needles available were too short and thus the LP was canceled - MRI was unable to be obtained due to his obesity - Neurology recommended IVIG for possible Guillain-Barr syndrome - The patient was also treated with high-dose thiamine-thiamine level on 7/29 was 105.8 -Serum copper level 127  - He has completed his course of IVIG and thiamine and now is much more ambulatory  Type 2 diabetes, uncontrolled with hyperglycemia and peripheral neuropathy - A1c 7.5 - Continue home medications      AKI - Creatinine 1.39 - Treated with IV fluids and holding lisinopril - Creatinine has improved to 1.06  Hypertension - Continue lisinopril   Alcohol and cocaine abuse - Has been counseled   Morbid obesity Body mass index is 54.91 kg/m.  BPH - Continue Flomax  Vitamin D deficiency - Vitamin D level was slightly low at 29.19 - He has been started on calcium plus vitamin D supplements twice daily  Discharge Exam: Vitals:   02/08/21 0827 02/08/21 0956  BP: (!) 102/48 125/82  Pulse: 73   Resp: 16   Temp: (!) 97.4 F (36.3 C)   SpO2: 100%    Vitals:   02/07/21 2314 02/08/21 0645 02/08/21 0827 02/08/21 0956  BP: 131/63 140/81 (!) 102/48 125/82  Pulse: 77 73 73   Resp: 16 18 16    Temp: Marland Kitchen)  97.2 F (36.2 C) 97.7 F (36.5 C) (!) 97.4 F (36.3 C)   TempSrc: Oral Oral Oral   SpO2: 99% 100% 100%   Weight:      Height:        General: Pt is alert, awake, not in acute distress Cardiovascular: RRR, S1/S2 +, no rubs, no gallops Respiratory: CTA bilaterally, no wheezing, no rhonchi Abdominal: Soft,  NT, ND, bowel sounds + Extremities: no edema, no cyanosis   Discharge Instructions  Discharge Instructions     Diet - low sodium heart healthy   Complete by: As directed    Diet Carb Modified   Complete by: As directed    Increase activity slowly   Complete by: As directed       Allergies as of 02/08/2021   No Known Allergies      Medication List     TAKE these medications    Accu-Chek Guide test strip Generic drug: glucose blood use as directed   Accu-Chek Guide w/Device Kit use as directed   Accu-Chek Softclix Lancets lancets Use as directed   Calcium-Vitamin D 600-400 MG-UNIT Tabs Take 1 tablet by mouth 2 (two) times daily.   diclofenac Sodium 1 % Gel Commonly known as: VOLTAREN Apply 2 g topically 4 (four) times daily.   furosemide 40 MG tablet Commonly known as: LASIX Take 1 tablet (40 mg total) by mouth daily as needed.   glipiZIDE 5 MG tablet Commonly known as: GLUCOTROL Take 1 tablet (5 mg total) by mouth 2 (two) times daily.   lidocaine 5 % Commonly known as: Lidoderm Place 1 patch onto the skin daily. Remove & Discard patch within 12 hours or as directed by MD   meloxicam 15 MG tablet Commonly known as: MOBIC Take 1 tablet (15 mg total) by mouth daily.   MULTIVITAMIN ADULT PO Take 1 tablet by mouth daily.   nystatin powder Commonly known as: nystatin Apply 1 application topically 3 (three) times daily.   polyethylene glycol 17 g packet Commonly known as: MIRALAX / GLYCOLAX Take 17 g by mouth daily.   pregabalin 225 MG capsule Commonly known as: LYRICA Take 1 capsule (225 mg total) by mouth 2 (two) times daily.   ProAir RespiClick 191 (90 Base) MCG/ACT Aepb Generic drug: Albuterol Sulfate Inhale 2 puffs into the lungs every 6 (six) hours as needed.   simvastatin 20 MG tablet Commonly known as: ZOCOR Take 20 mg by mouth daily.   tadalafil 10 MG tablet Commonly known as: CIALIS Take 1 tablet (10 mg total) by mouth every other  day as needed for erectile dysfunction.   tamsulosin 0.4 MG Caps capsule Commonly known as: FLOMAX Take 1 capsule (0.4 mg total) by mouth daily.   topiramate 100 MG tablet Commonly known as: TOPAMAX Take 1 tablet (100 mg total) by mouth 2 (two) times daily.   traZODone 150 MG tablet Commonly known as: DESYREL Take 150 mg by mouth at bedtime.   triamcinolone cream 0.1 % Commonly known as: KENALOG Apply 1 application topically 2 (two) times daily.   ursodiol 250 MG tablet Commonly known as: ACTIGALL Take 250 mg by mouth 2 (two) times daily.        Follow-up Information     Schedule an appointment as soon as possible for a visit  with Elsie Stain, MD.   Specialty: Pulmonary Disease Contact information: [address] [address] [phone]         Schedule an appointment as soon  as possible for a visit  with [place], [place], DO.   Contact information: [address] [place] [address] [phone]                No Known Allergies    CT Head Wo Contrast  Result Date: 02/02/2021 CLINICAL DATA:  55 year old male with transient ischemic attack. EXAM: CT HEAD WITHOUT CONTRAST TECHNIQUE: Contiguous axial images were obtained from the base of the skull through the vertex without intravenous contrast. COMPARISON:  Head CT dated 01/20/2021. FINDINGS: Brain: The ventricles and sulci appropriate size for patient's age. The gray-white matter discrimination is preserved. There is no acute intracranial hemorrhage. No mass effect or midline shift no extra-axial fluid collection. Vascular: No hyperdense vessel or unexpected calcification. Skull: Normal. Negative for fracture or focal lesion. Sinuses/Orbits: No acute finding. Other: Similar appearance of sub occipital subcutaneous density may be sequela of prior hemorrhage or scarring. IMPRESSION: Unremarkable noncontrast CT of the brain. Electronically Signed   By: Anner Crete M.D.    On: 02/02/2021 18:38   CT L-SPINE NO CHARGE  Result Date: 02/03/2021 CLINICAL DATA:  55 year old male with flank pain, severe low back pain and developing bilateral leg weakness. EXAM: CT LUMBAR SPINE WITHOUT CONTRAST TECHNIQUE: Technique: Multiplanar CT images of the lumbar spine were reconstructed from contemporary CT of the Abdomen and Pelvis. CONTRAST:  None. COMPARISON:  CT Abdomen and Pelvis today are reported separately. FINDINGS: Segmentation: Normal. Alignment: Normal lumbar segmentation.  No spondylolisthesis. Vertebrae: Bone mineralization is within normal limits. No acute osseous abnormality identified. Intact visible sacrum and SI joints with degenerative bilateral SI joint vacuum phenomena. Paraspinal and other soft tissues: Aortoiliac calcified atherosclerosis. Abdominal viscera are reported separately today. Lumbar paraspinal soft tissues remain within normal limits. Disc levels: T11-T12: Moderate facet hypertrophy. Circumferential disc bulge and endplate spurring. Mild to moderate spinal stenosis (series 1, image 22). Moderate to severe bilateral T11 foraminal stenosis. T12-L1: Mostly far lateral disc bulging and endplate spurring. No definite stenosis. L1-L2: Mostly far lateral disc bulging and endplate spurring. No definite stenosis. L2-L3: Disc space loss and vacuum disc. Circumferential disc osteophyte complex. Mild facet hypertrophy. Up to mild spinal stenosis. Mild to moderate L2 foraminal stenosis greater on the right. L3-L4: Mostly far lateral. Mild to moderate disc bulging and endplate facet spurring hypertrophy. No convincing spinal stenosis. Mild to moderate L3 foraminal stenosis greater on the left. L4-L5: Circumferential disc bulge and endplate spurring. Moderate to severe facet hypertrophy with vacuum facet greater on the left. But no convincing spinal stenosis. Moderate to severe right greater than left L4 foraminal stenosis. L5-S1: Disc space loss. Circumferential disc bulge with  endplate spurring. Moderate to severe facet hypertrophy greater on the right with prominent vacuum facet there. No convincing spinal stenosis. Moderate to severe left and severe right L5 foraminal stenosis. IMPRESSION: 1. No acute osseous abnormality in the lumbar spine. 2. The most convincing visible level of spinal stenosis is in the lower thoracic spine at T11-T12, related to bulky disc and posterior element hypertrophy. Query lower Thoracic Myelopathy. 3. Lumbar spine degeneration, with severe facet arthropathy L4-L5 and L5-S1. Severe disc degeneration L2-L3. But only mild lumbar spinal stenosis is suspected. Multilevel moderate and severe lumbar neural foraminal stenosis, maximal at the L4 and L5 nerve levels. 4. CT Abdomen and Pelvis today are reported separately. Aortic Atherosclerosis (ICD10-I70.0). Electronically Signed   By: Genevie Ann M.D.   On: 02/03/2021 04:45   CT Renal Stone Study  Result Date: 02/03/2021  CLINICAL DATA:  55 year old male with flank pain, severe low back pain and developing bilateral leg weakness. EXAM: CT ABDOMEN AND PELVIS WITHOUT CONTRAST TECHNIQUE: Multidetector CT imaging of the abdomen and pelvis was performed following the standard protocol without IV contrast. COMPARISON:  CT lumbar spine today reported separately. Noncontrast CT Chest, Abdomen and Pelvis 01/20/2021 FINDINGS: Lower chest: Lung bases are stable and negative. No pericardial or pleural effusion. Hepatobiliary: Round rim calcified 2.8 cm gallstone. Gallbladder remains fairly contracted with no adjacent inflammation. Negative noncontrast liver. Pancreas: Negative. Spleen: Negative. Adrenals/Urinary Tract: Normal adrenal glands. Kidneys are stable and nonobstructed. Occasional small simple fluid density renal cysts again noted. No nephrolithiasis. No perinephric inflammation. Ureters are decompressed and negative. Diminutive and unremarkable bladder. Stomach/Bowel: Bilobed fat containing umbilical hernia which  tracks caudally and to the right appears stable, noninflamed, and measures up to 12 cm in length (coronal image 29) with a roughly 2 cm hernia neck (sagittal image 80). As before no bowel is herniated. Negative large bowel aside from retained stool, redundancy of the sigmoid. Normal appendix on series 3, image 50. Negative terminal ileum. No dilated small bowel. Chronic postoperative changes to the greater curve of the stomach. The stomach and duodenum are decompressed. No free air, free fluid, mesenteric inflammation. Vascular/Lymphatic: Normal caliber abdominal aorta. Aortoiliac calcified atherosclerosis. Vascular patency is not evaluated in the absence of IV contrast. No lymphadenopathy. Reproductive: Negative. Other: No pelvic free fluid. Musculoskeletal: Lumbar spine detailed separately. Bulky flowing osteophytes in the visible lower thoracic spine. Stable visualized osseous structures. IMPRESSION: 1. No acute or inflammatory process identified in the non-contrast abdomen or pelvis. Stable noncontrast CT appearance of the abdomen and pelvis from earlier this month. 2. Cholelithiasis. Fat containing umbilical hernia. Prior gastric sleeve. 3. Lumbar Spine CT reported separately. Electronically Signed   By: Genevie Ann M.D.   On: 02/03/2021 04:40     The results of significant diagnostics from this hospitalization (including imaging, microbiology, ancillary and laboratory) are listed below for reference.     Microbiology: No results found for this or any previous visit (from the past 240 hour(s)).   Labs: BNP (last 3 results) No results for input(s): BNP in the last 8760 hours. Basic Metabolic Panel: No results for input(s): NA, K, CL, CO2, GLUCOSE, BUN, CREATININE, CALCIUM, MG, PHOS in the last 168 hours. Liver Function Tests: No results for input(s): AST, ALT, ALKPHOS, BILITOT, PROT, ALBUMIN in the last 168 hours. No results for input(s): LIPASE, AMYLASE in the last 168 hours. No results for  input(s): AMMONIA in the last 168 hours. CBC: No results for input(s): WBC, NEUTROABS, HGB, HCT, MCV, PLT in the last 168 hours. Cardiac Enzymes: No results for input(s): CKTOTAL, CKMB, CKMBINDEX, TROPONINI in the last 168 hours. BNP: Invalid input(s): POCBNP CBG: No results for input(s): GLUCAP in the last 168 hours. D-Dimer No results for input(s): DDIMER in the last 72 hours. Hgb A1c No results for input(s): HGBA1C in the last 72 hours. Lipid Profile No results for input(s): CHOL, HDL, LDLCALC, TRIG, CHOLHDL, LDLDIRECT in the last 72 hours. Thyroid function studies No results for input(s): TSH, T4TOTAL, T3FREE, THYROIDAB in the last 72 hours.  Invalid input(s): FREET3 Anemia work up No results for input(s): VITAMINB12, FOLATE, FERRITIN, TIBC, IRON, RETICCTPCT in the last 72 hours. Urinalysis    Component Value Date/Time   COLORURINE AMBER (A) 02/03/2021 0047   APPEARANCEUR HAZY (A) 02/03/2021 0047   LABSPEC 1.021 02/03/2021 0047   PHURINE 5.0 02/03/2021 0047  GLUCOSEU NEGATIVE 02/03/2021 0047   HGBUR NEGATIVE 02/03/2021 [place] 02/03/2021 [place] 02/03/2021 0047   PROTEINUR NEGATIVE 02/03/2021 0047   NITRITE NEGATIVE 02/03/2021 0047   LEUKOCYTESUR NEGATIVE 02/03/2021 0047   Sepsis Labs Invalid input(s): PROCALCITONIN,  WBC,  LACTICIDVEN Microbiology No results found for this or any previous visit (from the past 240 hour(s)).   Time coordinating discharge in minutes: 65  SIGNED:   Debbe Odea, MD  [facility] 02/20/2021, 8:44 PM

## 2021-02-22 ENCOUNTER — Other Ambulatory Visit: Payer: Self-pay | Admitting: Family Medicine

## 2021-02-22 ENCOUNTER — Other Ambulatory Visit: Payer: Self-pay

## 2021-02-22 ENCOUNTER — Other Ambulatory Visit (HOSPITAL_COMMUNITY): Payer: Self-pay

## 2021-02-22 ENCOUNTER — Encounter: Payer: Self-pay | Admitting: Family Medicine

## 2021-02-22 DIAGNOSIS — K219 Gastro-esophageal reflux disease without esophagitis: Secondary | ICD-10-CM

## 2021-02-22 MED ORDER — TOPIRAMATE 100 MG PO TABS
100.0000 mg | ORAL_TABLET | Freq: Two times a day (BID) | ORAL | 2 refills | Status: DC
  Filled 2021-02-22: qty 60, 30d supply, fill #0

## 2021-02-22 MED ORDER — TRAZODONE HCL 150 MG PO TABS
150.0000 mg | ORAL_TABLET | Freq: Every evening | ORAL | 1 refills | Status: DC | PRN
  Filled 2021-02-22: qty 30, 30d supply, fill #0

## 2021-02-22 MED ORDER — OMEPRAZOLE 40 MG PO CPDR
40.0000 mg | DELAYED_RELEASE_CAPSULE | Freq: Every day | ORAL | 0 refills | Status: DC
Start: 2021-02-22 — End: 2021-03-15
  Filled 2021-02-22: qty 30, 30d supply, fill #0

## 2021-02-22 MED ORDER — MELOXICAM 15 MG PO TABS
15.0000 mg | ORAL_TABLET | Freq: Every day | ORAL | 1 refills | Status: DC
  Filled 2021-02-22: qty 30, 30d supply, fill #0

## 2021-02-22 NOTE — Progress Notes (Signed)
Seen at [facility]  55 yo with obesity s/p sleeve gastrectomy, T2DM, cocaine abuse, BPH, etc  C/o CP today Notes present x90 days.  Intermittent, every few days.  Sharp, then dull.  L side of chest.  Not clear if anything makes it better.  Lasts 1-1.5 hrs.  Not worse when he was swimming.  Not necessarily worse with activity.  Can come on at rest.  Not really any particular activity that makes it worse.  No CP with exertion.  Seen in ED in 01/2021 at [facility] for similar symptoms -> negative trops.  Denies recent cocaine use (not in several months), but utox positive.  Denies CP at this time, last time it occurred was 1-2 days ago.  C/o worsening gerd sx  Meds reviewed  Vitals:   02/22/21 1952  BP: 120/80  Pulse: 86  SpO2: 97%   NAD RRR, no mgr - CP not reproducible  CTAB S/NT/ND No LEE  AP Chest pain - atypical in description.  Negative cardiac enzymes during w/u at [facility] in July.  He's not currently having any chest discomfort.  It's been going on at least 90 days, will discuss with PCP - consider cardiology referral?  Counseled on cocaine use.  Ddx broad, could be related to substance use, ?cardiac, though not typical anginal pain.  GI with worsening GERD.  ?musculoskeletal, anxiety, etc. He was given ED precautions, expresses understanding.    GERD Will send in PPI - maybe this will help above if that's GI?  Current Outpatient Medications  Medication Instructions   Accu-Chek Softclix Lancets lancets Use as directed   Albuterol Sulfate (PROAIR RESPICLICK) 229 (90 Base) MCG/ACT AEPB 2 puffs, Inhalation, Every 6 hours PRN   Blood Glucose Monitoring Suppl (ACCU-CHEK GUIDE) w/Device KIT use as directed   Calcium Carb-Cholecalciferol (CALCIUM-VITAMIN D) 600-400 MG-UNIT TABS 1 tablet, Oral, 2 times daily   ciclopirox (PENLAC) 8 % solution Topical, Daily at bedtime, Apply over nail and surrounding skin. Apply daily over previous coat. After seven (7) days, may remove with alcohol and continue  cycle.   diclofenac Sodium (VOLTAREN) 2 g, Topical, 4 times daily   furosemide (LASIX) 40 mg, Oral, Daily PRN   glipiZIDE (GLUCOTROL) 5 mg, Oral, 2 times daily   glucose blood (ACCU-CHEK GUIDE) test strip use as directed   lidocaine (LIDODERM) 5 % 1 patch, Transdermal, Every 24 hours, Remove & Discard patch within 12 hours or as directed by MD   lisinopril (ZESTRIL) 30 mg, Oral, Daily   meloxicam (MOBIC) 15 mg, Oral, Daily   metFORMIN (GLUCOPHAGE) 1,000 mg, Oral, 2 times daily with meals   Multiple Vitamin (MULTIVITAMIN ADULT PO) 1 tablet, Oral, Daily   nystatin powder 1 application, Topical, 3 times daily   polyethylene glycol (MIRALAX / GLYCOLAX) 17 g, Oral, Daily   pregabalin (LYRICA) 225 mg, Oral, 2 times daily   simvastatin (ZOCOR) 20 mg, Oral, Daily   tadalafil (CIALIS) 10 mg, Oral, Every 48 hours PRN   tamsulosin (FLOMAX) 0.4 mg, Oral, Daily   topiramate (TOPAMAX) 100 mg, Oral, 2 times daily   traZODone (DESYREL) 150 mg, Oral, At bedtime PRN   triamcinolone cream (KENALOG) 0.1 % 1 application, Topical, 2 times daily   Trulicity 7.98 mg, Subcutaneous, Weekly   ursodiol (ACTIGALL) 250 mg, Oral, 2 times daily

## 2021-02-23 ENCOUNTER — Other Ambulatory Visit (HOSPITAL_COMMUNITY): Payer: Self-pay

## 2021-03-01 ENCOUNTER — Other Ambulatory Visit: Payer: Self-pay

## 2021-03-01 ENCOUNTER — Ambulatory Visit: Payer: Medicaid Other | Attending: Critical Care Medicine | Admitting: Clinical

## 2021-03-01 ENCOUNTER — Telehealth: Payer: Self-pay | Admitting: Critical Care Medicine

## 2021-03-01 DIAGNOSIS — Z87898 Personal history of other specified conditions: Secondary | ICD-10-CM | POA: Diagnosis not present

## 2021-03-01 DIAGNOSIS — F1421 Cocaine dependence, in remission: Secondary | ICD-10-CM

## 2021-03-01 DIAGNOSIS — F4323 Adjustment disorder with mixed anxiety and depressed mood: Secondary | ICD-10-CM

## 2021-03-01 MED ORDER — PREGABALIN 225 MG PO CAPS
225.0000 mg | ORAL_CAPSULE | Freq: Two times a day (BID) | ORAL | 1 refills | Status: DC
  Filled 2021-03-01: qty 60, 30d supply, fill #0
  Filled 2021-04-18: qty 60, 30d supply, fill #1

## 2021-03-01 MED ORDER — TADALAFIL 10 MG PO TABS
10.0000 mg | ORAL_TABLET | ORAL | 1 refills | Status: DC | PRN
  Filled 2021-03-01 – 2021-03-15 (×3): qty 10, 20d supply, fill #0

## 2021-03-01 NOTE — Telephone Encounter (Signed)
Copied from [place] [id number]. Topic: General - Other >> Mar 01, 2021  8:22 AM Leward Quan A wrote: Reason for CRM: Patient called in he had an appointment scheduled at 8.30 this morning. He waited to hear about the transportation but none was scheduled so patient missed his visit Please advise Ph# [phone]

## 2021-03-01 NOTE — BH Specialist Note (Signed)
Integrated Behavioral Health Initial In-Person Visit  MRN: [id number] Name: Jeremy Bauer  Number of Integrated Behavioral Health Clinician visits:: 1/6 Session Start time: 9:00am  Session End time: 9:49am Total time:  49  minutes  Types of Service: Individual psychotherapy  Interpretor:No. Interpretor Name and Language: N/A   Warm Hand Off Completed.        Subjective: Jeremy Bauer is a 55 y.o. male accompanied by  self Patient was referred by PCP Delford Field for anxiety and depression. Patient reports the following symptoms/concerns: Pt reports feeling depressed, difficulty sleeping, self-esteem disturbances, decreased energy, anxiousness, excessive worrying, difficulty relaxing, and irritability. Reports that he notices himself getting frustrated easily and often overanalyzing. Reports experiencing racing thoughts. Reports a hx of cocaine use. Reports last using cocaine three months ago and still experiencing the desire to use cocaine. Reports a hx of alcohol use several months ago.  Duration of problem: 1+ year; Severity of problem: moderate  Objective: Mood: Anxious and Affect: Appropriate Risk of harm to self or others: No plan to harm self or others  Life Context: Family and Social: Reports that he doesn't have any children. Reports that he receives emotional support from his brother and mother.  School/Work: Reports that he receives disability income. 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 Reports that he currently takes trazodone to assist with sleep. Reports exercising as coping skill.  Life Changes: Reports that he was kicked out of his apartment about two months ago and has been staying in a shelter for the past six weeks. Reports that he was involved in a relationship and substance use which he believes contributed to homelessness. Reports that he often argues with individuals in the shelter and becomes irritated easily. Reports that he often overanalyzes.    Patient and/or Family's Strengths/Protective Factors: Social connections and Sense of purpose  Goals Addressed: Patient will: Reduce symptoms of: agitation, anxiety, and depression Increase knowledge and/or ability of: coping skills and self-management skills  Demonstrate ability to: Increase healthy adjustment to current life circumstances  Progress towards Goals: Ongoing  Interventions: Interventions utilized: Mindfulness or Management consultant, CBT Cognitive Behavioral Therapy, and Supportive Counseling  Standardized Assessments completed:  DAST, MDQ, AUDIT, GAD-7, and PHQ 9  Patient and/or Family Response: Pt receptive to tx. Pt will begin utilizing deep breathing exercises. Pt will begin improving cognitive processing skills. Pt receptive to psychoeducation provided on anxiety and depression. Pt also receptive to psychoeducation provided on substance use.  Patient Centered Plan: Patient is on the following Treatment Plan(s):  Stress, Anxiety, Depression  Assessment: MDQ was negative. DAST score was 5. Denies SI/HI. Denies auditory/visual hallucinations.  Patient currently experiencing anxiety and depression. Pt is having difficulty adjusting as a result of homelessness. Pt appears to have difficulty with cognitive processing skills. Pt appears to be anxious frequently and experience racing thoughts. Pt exhibits pressured speech during session. Pt also appears to be in remission from cocaine use.    Patient may benefit from ongoing therapy and medication management. Pt would also benefit from an outpatient substance abuse program. Pt would benefit from utilizing deepbreathing exercises and improving cognitive processing skills. LCSWA assisted pt with cognitive restructuring. LCSWA provided information on SAIOP and pt expressed that he will consider due to the desire to use cocaine at times. LCSWA will fu with pt.  Plan: Follow up with behavioral health clinician on :  03/22/21  Behavioral recommendations: Utilize deep breathing exercises Referral(s): Integrated [facility] (In Clinic) and Substance Abuse  Program "From scale of 1-10, how likely are you to follow plan?": 10  Irys Nigh C Raquel Racey, LCSW

## 2021-03-01 NOTE — Telephone Encounter (Signed)
Patient came by to request refills on his Lyrica and Cialis. Stated he is out. Asked if Ms. Myriam Jacobson would be able to bring them by. Patient could not access his mychart to send message directly.

## 2021-03-01 NOTE — Telephone Encounter (Signed)
Patient came in and still was able to be seen today

## 2021-03-01 NOTE — Telephone Encounter (Signed)
Refill Rx sent.

## 2021-03-02 ENCOUNTER — Other Ambulatory Visit: Payer: Self-pay

## 2021-03-15 ENCOUNTER — Encounter: Payer: Self-pay | Admitting: Critical Care Medicine

## 2021-03-15 ENCOUNTER — Other Ambulatory Visit: Payer: Self-pay

## 2021-03-15 ENCOUNTER — Other Ambulatory Visit (HOSPITAL_COMMUNITY): Payer: Self-pay

## 2021-03-15 ENCOUNTER — Other Ambulatory Visit: Payer: Self-pay | Admitting: Critical Care Medicine

## 2021-03-15 DIAGNOSIS — K219 Gastro-esophageal reflux disease without esophagitis: Secondary | ICD-10-CM

## 2021-03-15 MED ORDER — FOLIC ACID 1 MG PO TABS
1.0000 mg | ORAL_TABLET | Freq: Every day | ORAL | 1 refills | Status: DC
  Filled 2021-03-15: qty 30, 30d supply, fill #0
  Filled 2021-04-18: qty 30, 30d supply, fill #1
  Filled 2021-06-15: qty 30, 30d supply, fill #2
  Filled 2021-07-07: qty 30, 30d supply, fill #3
  Filled 2021-09-12: qty 30, 30d supply, fill #0

## 2021-03-15 MED ORDER — THIAMINE HCL 100 MG PO TABS
100.0000 mg | ORAL_TABLET | Freq: Every day | ORAL | 3 refills | Status: DC
  Filled 2021-03-15 – 2021-04-18 (×2): qty 60, 60d supply, fill #0
  Filled 2021-06-15: qty 30, 30d supply, fill #0

## 2021-03-15 MED ORDER — TOPIRAMATE 100 MG PO TABS
100.0000 mg | ORAL_TABLET | Freq: Two times a day (BID) | ORAL | 2 refills | Status: DC
  Filled 2021-03-15 – 2021-04-18 (×2): qty 60, 30d supply, fill #0

## 2021-03-15 MED ORDER — OMEPRAZOLE 40 MG PO CPDR
40.0000 mg | DELAYED_RELEASE_CAPSULE | Freq: Every day | ORAL | 4 refills | Status: DC
  Filled 2021-03-15: qty 30, 30d supply, fill #0

## 2021-03-15 MED ORDER — MELOXICAM 15 MG PO TABS
15.0000 mg | ORAL_TABLET | Freq: Every day | ORAL | 1 refills | Status: DC
  Filled 2021-03-15 – 2021-03-29 (×2): qty 30, 30d supply, fill #0
  Filled 2021-05-10: qty 30, 30d supply, fill #1

## 2021-03-15 MED ORDER — TAMSULOSIN HCL 0.4 MG PO CAPS
0.4000 mg | ORAL_CAPSULE | Freq: Every day | ORAL | 3 refills | Status: DC
  Filled 2021-05-10: qty 30, 30d supply, fill #0

## 2021-03-15 MED ORDER — TRAZODONE HCL 150 MG PO TABS
150.0000 mg | ORAL_TABLET | Freq: Every evening | ORAL | 1 refills | Status: DC | PRN
  Filled 2021-03-15 – 2021-04-18 (×2): qty 30, 30d supply, fill #0

## 2021-03-15 MED ORDER — GLIPIZIDE 5 MG PO TABS
5.0000 mg | ORAL_TABLET | Freq: Two times a day (BID) | ORAL | 3 refills | Status: DC
  Filled 2021-03-15: qty 60, 30d supply, fill #0
  Filled 2021-05-10: qty 60, 30d supply, fill #1

## 2021-03-16 ENCOUNTER — Other Ambulatory Visit (HOSPITAL_COMMUNITY): Payer: Self-pay

## 2021-03-16 NOTE — Progress Notes (Signed)
Patient ID: Jeremy Bauer, male   DOB: Jun 07, 1966, 55 y.o.   MRN: [id number] This is a 55 year old male seen in the [name] and is a primary care patient of mine at [facility] [employer] [facility].  Patient with morbid obesity diabetes chronic severe neuropathy with inability to walk properly using a Rollator now.  He has been going to water aerobics and this is improved significantly his status.  He has lost some weight.  On arrival blood pressure 130/84 blood sugars 143  He remembers now he was on folic acid and thiamine supplements after his bariatric surgery would like refills on this he also needs refills on his Flomax.  We discussed the Cialis that he wanted refills on the co-pay is quite high and he cannot afford it we told him this is not a medically necessary medication at this time we will hold off on this refill he was in agreement with this.  We reminded the patient of his upcoming appointments that he has and he is requesting [place] transportation reminded him that he has Medicaid and he can use Medicaid transportation we will provide him a number to call so he can arrange his transportation for his upcoming appointments.  He has an appointment with me for example September 12 at 1:30 [facility] he was reminded of this appointment date  We did refill also at this visit his meloxicam glipizide

## 2021-03-17 ENCOUNTER — Other Ambulatory Visit: Payer: Self-pay

## 2021-03-18 NOTE — Progress Notes (Deleted)
Established Patient Office Visit  Subjective:  Patient ID: Jeremy Bauer, male    DOB: [birth year]  Age: 55 y.o. MRN: [id number]  CC: No chief complaint on file.   HPI [employer] presents for ***  Past Medical History:  Diagnosis Date   Diabetes mellitus without complication ([place])    Hyperlipidemia    Hypertension     Past Surgical History:  Procedure Laterality Date   BARIATRIC SURGERY      No family history on file.  Social History   Socioeconomic History   Marital status: Single    Spouse name: Not on file   Number of children: Not on file   Years of education: Not on file   Highest education level: Not on file  Occupational History   Not on file  Tobacco Use   Smoking status: Never   Smokeless tobacco: Never  Substance and Sexual Activity   Alcohol use: Not on file   Drug use: Not on file   Sexual activity: Not on file  Other Topics Concern   Not on file  Social History Narrative   Not on file   Social Determinants of Health   Financial Resource Strain: Not on file  Food Insecurity: Not on file  Transportation Needs: Not on file  Physical Activity: Not on file  Stress: Not on file  Social Connections: Not on file  Intimate Partner Violence: Not on file    Outpatient Medications Prior to Visit  Medication Sig Dispense Refill   Accu-Chek Softclix Lancets lancets Use as directed 100 each 12   Albuterol Sulfate (PROAIR RESPICLICK) 761 (90 Base) MCG/ACT AEPB Inhale 2 puffs into the lungs every 6 (six) hours as needed. 1 each 4   Blood Glucose Monitoring Suppl (ACCU-CHEK GUIDE) w/Device KIT use as directed 1 kit 0   Calcium Carb-Cholecalciferol (CALCIUM-VITAMIN D) 600-400 MG-UNIT TABS Take 1 tablet by mouth 2 (two) times daily.     ciclopirox (PENLAC) 8 % solution Apply topically at bedtime. Apply over nail and surrounding skin. Apply daily over previous coat. After seven (7) days, may remove with alcohol and continue cycle. 6.6 mL 0   diclofenac  Sodium (VOLTAREN) 1 % GEL Apply 2 g topically 4 (four) times daily.     Dulaglutide (TRULICITY) 6.07 PX/1.0GY SOPN Inject 0.75 mg into the skin once a week. 2 mL 4   folic acid (FOLVITE) 1 MG tablet Take 1 tablet (1 mg total) by mouth daily. 60 tablet 1   furosemide (LASIX) 40 MG tablet Take 1 tablet (40 mg total) by mouth daily as needed. 30 tablet 3   glipiZIDE (GLUCOTROL) 5 MG tablet Take 1 tablet (5 mg total) by mouth 2 (two) times daily. 60 tablet 3   glucose blood (ACCU-CHEK GUIDE) test strip use as directed 100 strip 12   lidocaine (LIDODERM) 5 % Place 1 patch onto the skin daily. Remove & Discard patch within 12 hours or as directed by MD 30 patch 0   lisinopril (ZESTRIL) 30 MG tablet Take 1 tablet (30 mg total) by mouth daily. 90 tablet 2   meloxicam (MOBIC) 15 MG tablet Take 1 tablet (15 mg total) by mouth daily. 30 tablet 1   metFORMIN (GLUCOPHAGE) 1000 MG tablet Take 1 tablet (1,000 mg total) by mouth 2 (two) times daily with a meal. 180 tablet 1   Multiple Vitamin (MULTIVITAMIN ADULT PO) Take 1 tablet by mouth daily.     nystatin powder Apply 1 application topically 3 (  three) times daily. 60 g 2   omeprazole (PRILOSEC) 40 MG capsule Take 1 capsule (40 mg total) by mouth daily. 30 capsule 4   polyethylene glycol (MIRALAX / GLYCOLAX) 17 g packet Take 17 g by mouth daily. 14 each 2   pregabalin (LYRICA) 225 MG capsule Take 1 capsule (225 mg total) by mouth 2 (two) times daily. 60 capsule 1   simvastatin (ZOCOR) 20 MG tablet Take 20 mg by mouth daily.     tadalafil (CIALIS) 10 MG tablet Take 1 tablet (10 mg total) by mouth every other day as needed for erectile dysfunction. 10 tablet 1   tamsulosin (FLOMAX) 0.4 MG CAPS capsule Take 1 capsule (0.4 mg total) by mouth daily. 30 capsule 3   thiamine 100 MG tablet Take 1 tablet (100 mg total) by mouth daily. 60 tablet 3   topiramate (TOPAMAX) 100 MG tablet Take 1 tablet (100 mg total) by mouth 2 (two) times daily. 60 tablet 2   traZODone  (DESYREL) 150 MG tablet Take 1 tablet (150 mg total) by mouth at bedtime as needed for sleep. 30 tablet 1   triamcinolone cream (KENALOG) 0.1 % Apply 1 application topically 2 (two) times daily. 30 g 0   ursodiol (ACTIGALL) 250 MG tablet Take 250 mg by mouth 2 (two) times daily.     No facility-administered medications prior to visit.    No Known Allergies  ROS Review of Systems    Objective:    Physical Exam  There were no vitals taken for this visit. Wt Readings from Last 3 Encounters:  02/03/21 (!) 416 lb 3.2 oz (188.8 kg)  01/20/21 (!) 425 lb (192.8 kg)  01/16/21 (!) 425 lb (192.8 kg)     Health Maintenance Due  Topic Date Due   COVID-19 Vaccine (1) Never done   PNEUMOCOCCAL POLYSACCHARIDE VACCINE AGE 44-64 HIGH RISK  Never done   Pneumococcal Vaccine 72-58 Years old (1 - PCV) Never done   OPHTHALMOLOGY EXAM  Never done   COLONOSCOPY (Pts 45-23yrs Insurance coverage will need to be confirmed)  Never done   Zoster Vaccines- Shingrix (1 of 2) Never done   INFLUENZA VACCINE  Never done    There are no preventive care reminders to display for this patient.  Lab Results  Component Value Date   TSH 1.231 02/03/2021   Lab Results  Component Value Date   WBC 6.7 02/05/2021   HGB 10.2 (L) 02/05/2021   HCT 32.2 (L) 02/05/2021   MCV 104.2 (H) 02/05/2021   PLT 287 02/05/2021   Lab Results  Component Value Date   NA 135 02/07/2021   K 4.1 02/07/2021   CO2 22 02/07/2021   GLUCOSE 136 (H) 02/07/2021   BUN 20 02/07/2021   CREATININE 1.06 02/07/2021   BILITOT 0.5 02/02/2021   ALKPHOS 52 02/02/2021   AST 40 02/02/2021   ALT 75 (H) 02/02/2021   PROT 7.0 02/02/2021   ALBUMIN 3.4 (L) 02/02/2021   CALCIUM 8.8 (L) 02/07/2021   ANIONGAP 7 02/07/2021   EGFR 69 01/30/2021   No results found for: CHOL No results found for: HDL No results found for: LDLCALC No results found for: TRIG No results found for: CHOLHDL Lab Results  Component Value Date   HGBA1C 7.5 (H)  02/03/2021      Assessment & Plan:   Problem List Items Addressed This Visit   None   No orders of the defined types were placed in this encounter.   Follow-up: No  follow-ups on file.    Asencion Noble, MD

## 2021-03-20 ENCOUNTER — Ambulatory Visit: Payer: Medicaid Other | Admitting: Critical Care Medicine

## 2021-03-22 ENCOUNTER — Ambulatory Visit: Payer: Medicaid Other | Admitting: Clinical

## 2021-03-23 ENCOUNTER — Other Ambulatory Visit (HOSPITAL_COMMUNITY): Payer: Self-pay

## 2021-03-23 MED ORDER — TADALAFIL 10 MG PO TABS
10.0000 mg | ORAL_TABLET | ORAL | 1 refills | Status: DC | PRN
  Filled 2021-03-23: qty 10, 20d supply, fill #0
  Filled 2021-04-18 – 2021-05-17 (×2): qty 10, 20d supply, fill #1

## 2021-03-23 MED ORDER — TRULICITY 0.75 MG/0.5ML ~~LOC~~ SOAJ
0.7500 mg | SUBCUTANEOUS | 4 refills | Status: DC
  Filled 2021-03-23: qty 2, 28d supply, fill #0
  Filled 2021-04-18: qty 2, 28d supply, fill #1

## 2021-03-27 ENCOUNTER — Other Ambulatory Visit (HOSPITAL_COMMUNITY): Payer: Self-pay

## 2021-03-27 ENCOUNTER — Other Ambulatory Visit: Payer: Self-pay

## 2021-03-28 ENCOUNTER — Other Ambulatory Visit (HOSPITAL_COMMUNITY): Payer: Self-pay

## 2021-03-29 ENCOUNTER — Other Ambulatory Visit: Payer: Self-pay

## 2021-03-31 ENCOUNTER — Other Ambulatory Visit: Payer: Self-pay

## 2021-04-18 ENCOUNTER — Other Ambulatory Visit: Payer: Self-pay

## 2021-04-18 ENCOUNTER — Other Ambulatory Visit: Payer: Self-pay | Admitting: Critical Care Medicine

## 2021-04-19 ENCOUNTER — Other Ambulatory Visit: Payer: Self-pay

## 2021-04-19 MED ORDER — TRIAMCINOLONE ACETONIDE 0.1 % EX CREA
1.0000 "application " | TOPICAL_CREAM | Freq: Two times a day (BID) | CUTANEOUS | 2 refills | Status: DC
  Filled 2021-04-19: qty 30, 15d supply, fill #0

## 2021-04-19 NOTE — Telephone Encounter (Signed)
Requested Prescriptions  Pending Prescriptions Disp Refills  . triamcinolone cream (KENALOG) 0.1 % 30 g 2    Sig: Apply 1 application topically 2 (two) times daily.     Dermatology:  Corticosteroids Passed - 04/18/2021  2:18 PM      Passed - Valid encounter within last 12 months    Recent Outpatient Visits          3 months ago Type 2 diabetes mellitus with hyperglycemia, without long-term current use of insulin [facility])   [facility] Storm Frisk, MD      Future Appointments            In 3 weeks Storm Frisk, MD [facility]

## 2021-04-24 ENCOUNTER — Ambulatory Visit: Payer: Medicaid Other | Attending: Family Medicine | Admitting: Clinical

## 2021-04-24 ENCOUNTER — Other Ambulatory Visit: Payer: Self-pay

## 2021-04-24 DIAGNOSIS — F4323 Adjustment disorder with mixed anxiety and depressed mood: Secondary | ICD-10-CM | POA: Diagnosis not present

## 2021-04-26 ENCOUNTER — Encounter: Payer: Self-pay | Admitting: Podiatry

## 2021-04-27 ENCOUNTER — Ambulatory Visit: Payer: Self-pay

## 2021-04-27 NOTE — Telephone Encounter (Signed)
Pt called in saying he has a bone spur to the Right heel that is causing him moderate amount of pain. He has redness and tenderness to the area. It is making it difficult to walk around r/t the pain and having to use his walker. This has been going on for 8-10 days now and he wants it to get looked at. He c/o pain 7-8/10. Doesn't take anything for pain d/t he doesn't have anything at home. Advised we would need to schedule appt and 1st available was 05/10/21. Pt stated he already has appt on 05/11/21 with [name]. Advised he could go to [facility] or ED. Pt stated he needed a referral to go to UC and I explained that he could go to UC without a referral and he could even schedule an appt on their website. Address and phone number was given for Urgent care on [address]. Pt stated he was tired and wanted to go home for the day and would attempt to go tomorrow. Care advice given and pt verbalized understanding.    Reason for Disposition  [1] MODERATE pain (e.g., interferes with normal activities, limping) AND [2] present > 3 days  Answer Assessment - Initial Assessment Questions 1. ONSET: "When did the pain start?"      8-10 days 2. LOCATION: "Where is the pain located?"      Right, heel  3. PAIN: "How bad is the pain?"    (Scale 1-10; or mild, moderate, severe)  - MILD (1-3): doesn't interfere with normal activities.   - MODERATE (4-7): interferes with normal activities (e.g., work or school) or awakens from sleep, limping.   - SEVERE (8-10): excruciating pain, unable to do any normal activities, unable to walk.      7-8 4. WORK OR EXERCISE: "Has there been any recent work or exercise that involved this part of the body?"      no 5. CAUSE: "What do you think is causing the foot pain?"     Bone spur got new pair of different shoes that could have flared it up  6. OTHER SYMPTOMS: "Do you have any other symptoms?" (e.g., leg pain, rash, fever, numbness)     Redness, tender to touch 7. PREGNANCY: "Is  there any chance you are pregnant?" "When was your last menstrual period?"     N/A  Protocols used: Foot Pain-A-AH

## 2021-05-02 ENCOUNTER — Other Ambulatory Visit: Payer: Self-pay

## 2021-05-02 ENCOUNTER — Ambulatory Visit (INDEPENDENT_AMBULATORY_CARE_PROVIDER_SITE_OTHER): Payer: Medicaid Other | Admitting: Podiatry

## 2021-05-02 ENCOUNTER — Ambulatory Visit (INDEPENDENT_AMBULATORY_CARE_PROVIDER_SITE_OTHER): Payer: Medicaid Other

## 2021-05-02 ENCOUNTER — Telehealth: Payer: Self-pay | Admitting: *Deleted

## 2021-05-02 DIAGNOSIS — G8929 Other chronic pain: Secondary | ICD-10-CM

## 2021-05-02 DIAGNOSIS — M7731 Calcaneal spur, right foot: Secondary | ICD-10-CM

## 2021-05-02 DIAGNOSIS — M7661 Achilles tendinitis, right leg: Secondary | ICD-10-CM

## 2021-05-02 DIAGNOSIS — M79671 Pain in right foot: Secondary | ICD-10-CM

## 2021-05-02 DIAGNOSIS — E1142 Type 2 diabetes mellitus with diabetic polyneuropathy: Secondary | ICD-10-CM

## 2021-05-02 MED ORDER — DICLOFENAC SODIUM 1 % EX GEL
2.0000 g | Freq: Four times a day (QID) | CUTANEOUS | 2 refills | Status: DC
  Filled 2021-05-02 – 2021-05-10 (×2): qty 100, 12d supply, fill #0
  Filled 2021-07-07: qty 100, 12d supply, fill #1
  Filled 2021-09-12 – 2021-09-13 (×2): qty 100, 12d supply, fill #0
  Filled 2021-09-19: qty 100, 30d supply, fill #0

## 2021-05-02 NOTE — Telephone Encounter (Signed)
Patient is calling for a sooner appointment because his heel has progressively gotten worse, feels as though something is protruding from heel.  Patient has been scheduled with Dr Ardelle Anton 05/02/21@2 :45,confirmed w/patient.

## 2021-05-02 NOTE — Patient Instructions (Signed)

## 2021-05-03 NOTE — BH Specialist Note (Signed)
Integrated Behavioral Health Follow Up In-Person Visit  MRN: [id number] Name: Jeremy Bauer  Number of Integrated Behavioral Health Clinician visits: 2/6 Session Start time: 8:45am  Session End time: 9:20am Total time: 35  minutes  Types of Service: Individual psychotherapy  Interpretor:No. Interpretor Name and Language: N/A  Subjective: GRIFF BADLEY is a 55 y.o. male accompanied by  self Patient was referred by PCP Shan Levans, MD for anxiety and depression. Patient reports the following symptoms/concerns: Reports feeling depressed, overeating, decreased energy, trouble concentrating, anxiousness, worrying, and irritability. Reports that he gets frustrated easily which has led to frequent arguments.  Duration of problem: 1+ year; Severity of problem: moderate  Objective: Mood: Anxious and Affect: Appropriate Risk of harm to self or others: No plan to harm self or others  Life Context: Family and Social: Reports that he doesn't have any children. Reports that he receives emotional support from his brother and mother.  School/Work: Reports that he receives disability income. 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 Reports that he currently takes trazodone to assist with sleep. Reports exercising as coping skill.  Life Changes: Reports that he has recently left the shelter and is currently staying in a rooming house.   Patient and/or Family's Strengths/Protective Factors: Social connections  Goals Addressed: Patient will:  Reduce symptoms of: agitation, anxiety, and depression   Increase knowledge and/or ability of: coping skills and self-management skills   Demonstrate ability to: Increase healthy adjustment to current life circumstances  Progress towards Goals: Ongoing  Interventions: Interventions utilized:  Mindfulness or Management consultant, CBT Cognitive Behavioral Therapy, Supportive Counseling, and Psychoeducation and/or Health Education Standardized  Assessments completed: GAD-7 and PHQ 9 GAD 7 : Generalized Anxiety Score 04/24/2021 03/01/2021  Nervous, Anxious, on Edge 2 2  Control/stop worrying 1 2  Worry too much - different things 2 2  Trouble relaxing 1 2  Restless 0 1  Easily annoyed or irritable 2 2  Afraid - awful might happen 0 1  Total GAD 7 Score 8 12   Depression screen [facility] 2/9 04/24/2021 03/01/2021  Decreased Interest 1 1  Down, Depressed, Hopeless 2 2  PHQ - 2 Score 3 3  Altered sleeping 1 2  Tired, decreased energy 1 1  Change in appetite 2 1  Feeling bad or failure about yourself  0 3  Trouble concentrating 2 0  Moving slowly or fidgety/restless 0 0  Suicidal thoughts 0 0  PHQ-9 Score 9 10      Patient and/or Family Response: Pt receptive to tx. Pt receptive to psychoeducation provided on depression, anxiety, irritability, and substance use. Pt receptive to cognitive restructuring utilized to decrease negative thoughts and assist with cognitive processing skills. Pt receptive to utilizing deep breathing exercises and adhering to medication from provider.   Patient Centered Plan: Patient is on the following Treatment Plan(s): Stress, Depression and Anxiety  Assessment: Denies SI/HI. Denies auditory/visual hallucinations. No safety risks. Patient currently experiencing anxiety and depression related to current stressors. Pt's housing circumstances have changed due to currently staying in a rooming house but is experiencing problems with his landlord. Pt experiences problems with irritability and difficulty controlling his response to anger due to getting into arguments.Pt appears to have cognitive processing skills. Pt continues to deny cocaine use.   Patient may benefit from ongoing therapy and medication management. Pt has not utilized provided information in previous session on SAIOP. LCSWA provided psychoeducation on depression, anxiety, irritability, and substance use. LCSWA attempted to normalize  pt's difficulty  with irritability. LCSWA utilized cognitive restructuring to decrease negative thoughts and assist with cognitive processing skills. LCSWA will discuss referral for psychiatry with pt. LCSWA will fu with pt.  Plan: Follow up with behavioral health clinician on : 05/15/21 Behavioral recommendations: Utilize deep breathing exercises Referral(s): Integrated Behavioral Health Services (In Clinic) and Substance Abuse Program "From scale of 1-10, how likely are you to follow plan?": 10  Errika Narvaiz C Kathlen Sakurai, LCSW

## 2021-05-05 NOTE — Progress Notes (Signed)
Subjective:   Patient ID: Jeremy Bauer, male   DOB: 55 y.o.   MRN: [id number]   HPI 55 year old male presents the office today for concerns of pain to the back of his right heel.  He states has been ongoing since his last appointment with Dr. Prudence Davidson.  He states he gets some swelling to the back of his heel.  He denies any recent injury or trauma.  He states that since the last appointment the symptoms have progressed.  He is asking for steroid injection.  He has no other concerns today.  A1c was 7.5   Review of Systems  All other systems reviewed and are negative.  Past Medical History:  Diagnosis Date   Diabetes mellitus without complication ([place])    Hyperlipidemia    Hypertension     Past Surgical History:  Procedure Laterality Date   BARIATRIC SURGERY       Current Outpatient Medications:    diclofenac Sodium (VOLTAREN) 1 % GEL, Apply 2 g topically 4 (four) times daily. Rub into affected area of foot 2 to 4 times daily, Disp: 100 g, Rfl: 2   Accu-Chek Softclix Lancets lancets, Use as directed, Disp: 100 each, Rfl: 12   Albuterol Sulfate (PROAIR RESPICLICK) 768 (90 Base) MCG/ACT AEPB, Inhale 2 puffs into the lungs every 6 (six) hours as needed., Disp: 1 each, Rfl: 4   Blood Glucose Monitoring Suppl (ACCU-CHEK GUIDE) w/Device KIT, use as directed, Disp: 1 kit, Rfl: 0   Calcium Carb-Cholecalciferol (CALCIUM-VITAMIN D) 600-400 MG-UNIT TABS, Take 1 tablet by mouth 2 (two) times daily., Disp: , Rfl:    ciclopirox (PENLAC) 8 % solution, Apply topically at bedtime. Apply over nail and surrounding skin. Apply daily over previous coat. After seven (7) days, may remove with alcohol and continue cycle., Disp: 6.6 mL, Rfl: 0   diclofenac Sodium (VOLTAREN) 1 % GEL, Apply 2 g topically 4 (four) times daily., Disp: , Rfl:    Dulaglutide (TRULICITY) 1.15 BW/6.2MB SOPN, Inject 0.75 mg into the skin once a week., Disp: 2 mL, Rfl: 4   folic acid (FOLVITE) 1 MG tablet, Take 1 tablet (1 mg total) by  mouth daily., Disp: 60 tablet, Rfl: 1   furosemide (LASIX) 40 MG tablet, Take 1 tablet (40 mg total) by mouth daily as needed., Disp: 30 tablet, Rfl: 3   glipiZIDE (GLUCOTROL) 5 MG tablet, Take 1 tablet (5 mg total) by mouth 2 (two) times daily., Disp: 60 tablet, Rfl: 3   glucose blood (ACCU-CHEK GUIDE) test strip, use as directed, Disp: 100 strip, Rfl: 12   lidocaine (LIDODERM) 5 %, Place 1 patch onto the skin daily. Remove & Discard patch within 12 hours or as directed by MD, Disp: 30 patch, Rfl: 0   lisinopril (ZESTRIL) 30 MG tablet, Take 1 tablet (30 mg total) by mouth daily., Disp: 90 tablet, Rfl: 2   meloxicam (MOBIC) 15 MG tablet, Take 1 tablet (15 mg total) by mouth daily., Disp: 30 tablet, Rfl: 1   metFORMIN (GLUCOPHAGE) 1000 MG tablet, Take 1 tablet (1,000 mg total) by mouth 2 (two) times daily with a meal., Disp: 180 tablet, Rfl: 1   Multiple Vitamin (MULTIVITAMIN ADULT PO), Take 1 tablet by mouth daily., Disp: , Rfl:    nystatin powder, Apply 1 application topically 3 (three) times daily., Disp: 60 g, Rfl: 2   omeprazole (PRILOSEC) 40 MG capsule, Take 1 capsule (40 mg total) by mouth daily., Disp: 30 capsule, Rfl: 4   polyethylene glycol (MIRALAX /  GLYCOLAX) 17 g packet, Take 17 g by mouth daily., Disp: 14 each, Rfl: 2   pregabalin (LYRICA) 225 MG capsule, Take 1 capsule (225 mg total) by mouth 2 (two) times daily., Disp: 60 capsule, Rfl: 1   simvastatin (ZOCOR) 20 MG tablet, Take 20 mg by mouth daily., Disp: , Rfl:    tadalafil (CIALIS) 10 MG tablet, Take 1 tablet (10 mg total) by mouth every other day as needed for erectile dysfunction., Disp: 10 tablet, Rfl: 1   tamsulosin (FLOMAX) 0.4 MG CAPS capsule, Take 1 capsule (0.4 mg total) by mouth daily., Disp: 30 capsule, Rfl: 3   thiamine 100 MG tablet, Take 1 tablet (100 mg total) by mouth daily., Disp: 60 tablet, Rfl: 3   topiramate (TOPAMAX) 100 MG tablet, Take 1 tablet (100 mg total) by mouth 2 (two) times daily., Disp: 60 tablet, Rfl:  2   traZODone (DESYREL) 150 MG tablet, Take 1 tablet (150 mg total) by mouth at bedtime as needed for sleep., Disp: 30 tablet, Rfl: 1   triamcinolone cream (KENALOG) 0.1 %, Apply 1 application topically 2 (two) times daily., Disp: 30 g, Rfl: 2   ursodiol (ACTIGALL) 250 MG tablet, Take 250 mg by mouth 2 (two) times daily., Disp: , Rfl:   No Known Allergies        Objective:  Physical Exam  General: AAO x3, NAD  Dermatological: Skin is warm, dry and supple bilateral. There are no open sores, no preulcerative lesions, no rash or signs of infection present.  Vascular: Dorsalis Pedis artery and Posterior Tibial artery pedal pulses are 2/4 bilateral with immedate capillary fill time.  There is no pain with calf compression, swelling, warmth, erythema.   Neruologic: Grossly intact via light touch bilateral. Negative tinel sign.   Musculoskeletal: There is tenderness palpation of the posterior aspect of the calcaneus and the insertion of the Achilles tendon.  Posterior heel spur is palpable.  There is minimal edema.  There is no erythema or warmth.  No pain with lateral compression of calcaneus.  No other areas of discomfort.  Muscular strength 5/5 in all groups tested bilateral.  Gait: Unassisted, Nonantalgic.       Assessment:   55 year old male with posterior heel spur, insertional Achilles tendinitis.     Plan:  -Treatment options discussed including all alternatives, risks, and complications -Etiology of symptoms were discussed -X-rays were obtained and reviewed with the patient.  History of heel spurs present.  No evidence of acute fracture. -Given the location would advise against a steroid injection.  We discussed offloading I dispensed an Achilles gel sleeve.  Heel lift.  Discussed traction, icing daily.  Discussed topical medications like Voltaren gel.  Physical therapy if symptoms persist.  -Rx for diabetic shoes.

## 2021-05-09 ENCOUNTER — Other Ambulatory Visit: Payer: Self-pay

## 2021-05-10 ENCOUNTER — Other Ambulatory Visit: Payer: Self-pay

## 2021-05-11 ENCOUNTER — Other Ambulatory Visit: Payer: Self-pay

## 2021-05-11 ENCOUNTER — Ambulatory Visit: Payer: Medicaid Other | Admitting: Critical Care Medicine

## 2021-05-12 ENCOUNTER — Telehealth (INDEPENDENT_AMBULATORY_CARE_PROVIDER_SITE_OTHER): Payer: Self-pay

## 2021-05-12 ENCOUNTER — Telehealth: Payer: Self-pay | Admitting: Critical Care Medicine

## 2021-05-12 NOTE — Telephone Encounter (Signed)
Copied from CRM [id number]. Topic: Appointment Scheduling - Scheduling Inquiry for Clinic >> May 10, 2021  3:44 PM Elliot Gault wrote: Patient wanted to inform PCP her had to [facility] his 05/12/2021 appointment due to assisting with early voting and wanted to make PCP aware he [facility] to 05/22/2021  Patient would [facility] to [facility] appointment to the same day he sees PCP

## 2021-05-15 ENCOUNTER — Ambulatory Visit: Payer: Medicaid Other | Admitting: Clinical

## 2021-05-15 NOTE — Telephone Encounter (Signed)
Thank you Asante.  I hope he comes for his appt with me next week 11/14

## 2021-05-16 ENCOUNTER — Ambulatory Visit: Payer: Medicaid Other | Admitting: Podiatry

## 2021-05-17 ENCOUNTER — Other Ambulatory Visit (HOSPITAL_COMMUNITY): Payer: Self-pay

## 2021-05-21 NOTE — Progress Notes (Signed)
Established Patient Office Visit  Subjective:  Patient ID: Jeremy Bauer, male    DOB: 1966-02-18  Age: 55 y.o. MRN: [id number]  CC:  Chief Complaint  Patient presents with   Diabetes     HPI Jeremy Bauer presents for primary care follow-up visit Patient with pre-existing morbid obesity chronic peripheral neuropathy hypertension type 2 diabetes alcohol use in the past.  Prior history of gastric bypass surgery with malabsorption, hyperlipidemia. Also history of sleep apnea on BiPAP.  Chronic weakness both lower extremities.  Prior history of cocaine use.  Patient is managed for his diabetes with metformin, glipizide, Trulicity.  Patient's blood sugar on arrival is 135.  He is taking his lisinopril for blood pressure blood pressure is good 138/86.  Patient is questioning whether he benefit from vitamin supplementation.  He cannot afford oral vitamins over-the-counter at this time because of finances.  Patient does have heel spurs follows with podiatry for this uses topical Voltaren gel and he will elevations support.  Patient has an appointment for his diabetic shoe assessment.  Patient did agree to receive Pneumovax Prevnar 20 and flu vaccine at this visit.  Patient did get a colonoscopy at age 36 and a previous health system and was negative.    Past Medical History:  Diagnosis Date   Diabetes mellitus without complication ([place])    Hyperlipidemia    Hypertension     Past Surgical History:  Procedure Laterality Date   BARIATRIC SURGERY      History reviewed. No pertinent family history.  Social History   Socioeconomic History   Marital status: Single    Spouse name: Not on file   Number of children: Not on file   Years of education: Not on file   Highest education level: Not on file  Occupational History   Not on file  Tobacco Use   Smoking status: Never   Smokeless tobacco: Never  Substance and Sexual Activity   Alcohol use: Not on file   Drug use: Not on  file   Sexual activity: Not on file  Other Topics Concern   Not on file  Social History Narrative   Not on file   Social Determinants of Health   Financial Resource Strain: Not on file  Food Insecurity: Not on file  Transportation Needs: Not on file  Physical Activity: Not on file  Stress: Not on file  Social Connections: Not on file  Intimate Partner Violence: Not on file    Outpatient Medications Prior to Visit  Medication Sig Dispense Refill   Albuterol Sulfate (PROAIR RESPICLICK) 524 (90 Base) MCG/ACT AEPB Inhale 2 puffs into the lungs every 6 (six) hours as needed. 1 each 4   Blood Glucose Monitoring Suppl (ACCU-CHEK GUIDE) w/Device KIT use as directed 1 kit 0   diclofenac Sodium (VOLTAREN) 1 % GEL Apply 2 g topically 4 (four) times daily. Rub into affected area of foot 2 to 4 times daily 818 g 2   folic acid (FOLVITE) 1 MG tablet Take 1 tablet (1 mg total) by mouth daily. 60 tablet 1   furosemide (LASIX) 40 MG tablet Take 1 tablet (40 mg total) by mouth daily as needed. 30 tablet 3   Multiple Vitamin (MULTIVITAMIN ADULT PO) Take 1 tablet by mouth daily.     nystatin powder Apply 1 application topically 3 (three) times daily. 60 g 2   pregabalin (LYRICA) 225 MG capsule Take 1 capsule (225 mg total) by mouth 2 (two) times daily.  60 capsule 1   tadalafil (CIALIS) 10 MG tablet Take 1 tablet (10 mg total) by mouth every other day as needed for erectile dysfunction. 10 tablet 1   thiamine 100 MG tablet Take 1 tablet (100 mg total) by mouth daily. 60 tablet 3   Accu-Chek Softclix Lancets lancets Use as directed 100 each 12   diclofenac Sodium (VOLTAREN) 1 % GEL Apply 2 g topically 4 (four) times daily.     Dulaglutide (TRULICITY) 4.56 YB/6.3SL SOPN Inject 0.75 mg into the skin once a week. 2 mL 4   glipiZIDE (GLUCOTROL) 5 MG tablet Take 1 tablet (5 mg total) by mouth 2 (two) times daily. 60 tablet 3   glucose blood (ACCU-CHEK GUIDE) test strip use as directed 100 strip 12    lidocaine (LIDODERM) 5 % Place 1 patch onto the skin daily. Remove & Discard patch within 12 hours or as directed by MD 30 patch 0   lisinopril (ZESTRIL) 30 MG tablet Take 1 tablet (30 mg total) by mouth daily. 90 tablet 2   meloxicam (MOBIC) 15 MG tablet Take 1 tablet (15 mg total) by mouth daily. 30 tablet 1   metFORMIN (GLUCOPHAGE) 1000 MG tablet Take 1 tablet (1,000 mg total) by mouth 2 (two) times daily with a meal. 180 tablet 1   omeprazole (PRILOSEC) 40 MG capsule Take 1 capsule (40 mg total) by mouth daily. 30 capsule 4   polyethylene glycol (MIRALAX / GLYCOLAX) 17 g packet Take 17 g by mouth daily. 14 each 2   simvastatin (ZOCOR) 20 MG tablet Take 20 mg by mouth daily.     tamsulosin (FLOMAX) 0.4 MG CAPS capsule Take 1 capsule (0.4 mg total) by mouth daily. 30 capsule 3   topiramate (TOPAMAX) 100 MG tablet Take 1 tablet (100 mg total) by mouth 2 (two) times daily. 60 tablet 2   traZODone (DESYREL) 150 MG tablet Take 1 tablet (150 mg total) by mouth at bedtime as needed for sleep. 30 tablet 1   triamcinolone cream (KENALOG) 0.1 % Apply 1 application topically 2 (two) times daily. 30 g 2   ursodiol (ACTIGALL) 250 MG tablet Take 250 mg by mouth 2 (two) times daily.     Calcium Carb-Cholecalciferol (CALCIUM-VITAMIN D) 600-400 MG-UNIT TABS Take 1 tablet by mouth 2 (two) times daily. (Patient not taking: Reported on 05/22/2021)     ciclopirox (PENLAC) 8 % solution Apply topically at bedtime. Apply over nail and surrounding skin. Apply daily over previous coat. After seven (7) days, may remove with alcohol and continue cycle. (Patient not taking: Reported on 05/22/2021) 6.6 mL 0   No facility-administered medications prior to visit.    No Known Allergies  ROS Review of Systems  Constitutional:  Negative for chills, diaphoresis and fever.  HENT:  Negative for congestion, hearing loss, nosebleeds, sore throat and tinnitus.   Eyes:  Negative for photophobia and redness.  Respiratory:   Negative for cough, shortness of breath, wheezing and stridor.   Cardiovascular:  Negative for chest pain, palpitations and leg swelling.  Gastrointestinal:  Negative for abdominal pain, blood in stool, constipation, diarrhea, nausea and vomiting.  Endocrine: Negative for polydipsia.  Genitourinary:  Negative for dysuria, flank pain, frequency, hematuria and urgency.  Musculoskeletal:  Positive for back pain and gait problem. Negative for myalgias and neck pain.       Foot numbness  Skin: Negative.  Negative for rash.  Allergic/Immunologic: Negative for environmental allergies.  Neurological:  Negative for dizziness, tremors, seizures, weakness  and headaches.  Hematological:  Does not bruise/bleed easily.  Psychiatric/Behavioral:  Positive for sleep disturbance. Negative for suicidal ideas. The patient is nervous/anxious.      Objective:    Physical Exam Vitals reviewed.  Constitutional:      Appearance: Normal appearance. He is well-developed. He is obese. He is not diaphoretic.  HENT:     Head: Normocephalic and atraumatic.     Nose: No nasal deformity, septal deviation, mucosal edema or rhinorrhea.     Right Sinus: No maxillary sinus tenderness or frontal sinus tenderness.     Left Sinus: No maxillary sinus tenderness or frontal sinus tenderness.     Mouth/Throat:     Mouth: Mucous membranes are moist.     Pharynx: Oropharynx is clear. No oropharyngeal exudate.     Comments: Has dentures in the upper teeth area partial dentures lower Eyes:     General: No scleral icterus.    Conjunctiva/sclera: Conjunctivae normal.     Pupils: Pupils are equal, round, and reactive to light.  Neck:     Thyroid: No thyromegaly.     Vascular: No carotid bruit or JVD.     Trachea: Trachea normal. No tracheal tenderness or tracheal deviation.  Cardiovascular:     Rate and Rhythm: Normal rate and regular rhythm.     Chest Wall: PMI is not displaced.     Pulses: Normal pulses. No decreased pulses.      Heart sounds: Normal heart sounds, S1 normal and S2 normal. Heart sounds not distant. No murmur heard. No systolic murmur is present.  No diastolic murmur is present.    No friction rub. No gallop. No S3 or S4 sounds.  Pulmonary:     Effort: Pulmonary effort is normal. No tachypnea, accessory muscle usage or respiratory distress.     Breath sounds: Normal breath sounds. No stridor. No decreased breath sounds, wheezing, rhonchi or rales.  Chest:     Chest wall: No tenderness.  Abdominal:     General: Bowel sounds are normal. There is no distension.     Palpations: Abdomen is soft. Abdomen is not rigid.     Tenderness: There is no abdominal tenderness. There is no guarding or rebound.  Musculoskeletal:        General: Normal range of motion.     Cervical back: Normal range of motion and neck supple. No edema, erythema or rigidity. No muscular tenderness. Normal range of motion.  Lymphadenopathy:     Head:     Right side of head: No submental or submandibular adenopathy.     Left side of head: No submental or submandibular adenopathy.     Cervical: No cervical adenopathy.  Skin:    General: Skin is warm and dry.     Coloration: Skin is not pale.     Findings: No rash.     Nails: There is no clubbing.  Neurological:     General: No focal deficit present.     Mental Status: He is alert and oriented to person, place, and time. Mental status is at baseline.     Sensory: No sensory deficit.  Psychiatric:        Mood and Affect: Mood normal.        Speech: Speech normal.        Behavior: Behavior normal.        Thought Content: Thought content normal.        Judgment: Judgment normal.    BP 138/86   Pulse  71   Resp 16   Wt (!) 412 lb 3.2 oz (187 kg)   SpO2 98%   BMI 54.38 kg/m  Wt Readings from Last 3 Encounters:  05/22/21 (!) 412 lb 3.2 oz (187 kg)  02/03/21 (!) 416 lb 3.2 oz (188.8 kg)  01/20/21 (!) 425 lb (192.8 kg)     Health Maintenance Due  Topic Date Due    COVID-19 Vaccine (1) Never done   OPHTHALMOLOGY EXAM  Never done   Zoster Vaccines- Shingrix (1 of 2) Never done    There are no preventive care reminders to display for this patient.  Lab Results  Component Value Date   TSH 1.231 02/03/2021   Lab Results  Component Value Date   WBC 6.7 02/05/2021   HGB 10.2 (L) 02/05/2021   HCT 32.2 (L) 02/05/2021   MCV 104.2 (H) 02/05/2021   PLT 287 02/05/2021   Lab Results  Component Value Date   NA 135 02/07/2021   K 4.1 02/07/2021   CO2 22 02/07/2021   GLUCOSE 136 (H) 02/07/2021   BUN 20 02/07/2021   CREATININE 1.06 02/07/2021   BILITOT 0.5 02/02/2021   ALKPHOS 52 02/02/2021   AST 40 02/02/2021   ALT 75 (H) 02/02/2021   PROT 7.0 02/02/2021   ALBUMIN 3.4 (L) 02/02/2021   CALCIUM 8.8 (L) 02/07/2021   ANIONGAP 7 02/07/2021   EGFR 69 01/30/2021   No results found for: CHOL No results found for: HDL No results found for: LDLCALC No results found for: TRIG No results found for: CHOLHDL Lab Results  Component Value Date   HGBA1C 7.5 (H) 02/03/2021      Assessment & Plan:   Problem List Items Addressed This Visit       Cardiovascular and Mediastinum   Primary hypertension    Blood pressure well controlled continue lisinopril      Relevant Medications   lisinopril (ZESTRIL) 30 MG tablet   simvastatin (ZOCOR) 20 MG tablet   Other Relevant Orders   Comprehensive metabolic panel   CBC with Differential/Platelet     Endocrine   Diabetic peripheral neuropathy associated with type 2 diabetes mellitus ([place])    Plan for now to continue Lyrica 220 mg twice daily      Relevant Medications   Dulaglutide (TRULICITY) 5.17 OH/6.0VP SOPN   glipiZIDE (GLUCOTROL) 5 MG tablet   lisinopril (ZESTRIL) 30 MG tablet   metFORMIN (GLUCOPHAGE) 1000 MG tablet   simvastatin (ZOCOR) 20 MG tablet   topiramate (TOPAMAX) 100 MG tablet   traZODone (DESYREL) 150 MG tablet   Type 2 diabetes mellitus ([facility]) - Primary    We will check hemoglobin  A1c and follow-up labs continue Trulicity metformin and glipizide      Relevant Medications   Dulaglutide (TRULICITY) 7.10 GY/6.9SW SOPN   glipiZIDE (GLUCOTROL) 5 MG tablet   lisinopril (ZESTRIL) 30 MG tablet   metFORMIN (GLUCOPHAGE) 1000 MG tablet   simvastatin (ZOCOR) 20 MG tablet   Other Relevant Orders   Comprehensive metabolic panel   POCT glucose (manual entry) (Completed)     Other   Morbid (severe) obesity due to excess calories [facility])    Patient has pending appointment with bariatric clinic encouraged to keep this appointment      Relevant Medications   Dulaglutide (TRULICITY) 5.46 EV/0.3JK SOPN   glipiZIDE (GLUCOTROL) 5 MG tablet   metFORMIN (GLUCOPHAGE) 1000 MG tablet   Cocaine abuse ([place])    Patient states he is currently not using  Chronic pain of both knees    Bilateral chronic knee pain exacerbated by morbid obesity will refer to orthopedics      Relevant Medications   meloxicam (MOBIC) 15 MG tablet   topiramate (TOPAMAX) 100 MG tablet   traZODone (DESYREL) 150 MG tablet   Other Relevant Orders   Ambulatory referral to Orthopedic Surgery   Heel spur, unspecified laterality    Care per podiatry      Other Visit Diagnoses     Gastroesophageal reflux disease, unspecified whether esophagitis present       Relevant Medications   omeprazole (PRILOSEC) 40 MG capsule   polyethylene glycol (MIRALAX / GLYCOLAX) 17 g packet   ursodiol (ACTIGALL) 250 MG tablet   Other specified intestinal malabsorption       Relevant Orders   Vitamin B1   Folate   Vitamin B12   Hemoglobin A1c   Need for immunization against influenza       Relevant Orders   Flu Vaccine QUAD 66moIM (Fluarix, Fluzone & Alfiuria Quad PF) (Completed)   Need for Streptococcus pneumoniae vaccination       Relevant Orders   Pneumococcal conjugate vaccine 20-valent (Completed)       Meds ordered this encounter  Medications   Accu-Chek Softclix Lancets lancets    Sig: Use as directed     Dispense:  100 each    Refill:  12   Dulaglutide (TRULICITY) 08.14MGY/1.8HUSOPN    Sig: Inject 0.75 mg into the skin once a week.    Dispense:  2 mL    Refill:  4   glipiZIDE (GLUCOTROL) 5 MG tablet    Sig: Take 1 tablet (5 mg total) by mouth 2 (two) times daily.    Dispense:  60 tablet    Refill:  3   glucose blood (ACCU-CHEK GUIDE) test strip    Sig: use as directed    Dispense:  100 strip    Refill:  12   lisinopril (ZESTRIL) 30 MG tablet    Sig: Take 1 tablet (30 mg total) by mouth daily.    Dispense:  90 tablet    Refill:  2   meloxicam (MOBIC) 15 MG tablet    Sig: Take 1 tablet (15 mg total) by mouth daily.    Dispense:  30 tablet    Refill:  1   metFORMIN (GLUCOPHAGE) 1000 MG tablet    Sig: Take 1 tablet (1,000 mg total) by mouth 2 (two) times daily with a meal.    Dispense:  180 tablet    Refill:  1   omeprazole (PRILOSEC) 40 MG capsule    Sig: Take 1 capsule (40 mg total) by mouth daily.    Dispense:  30 capsule    Refill:  4   polyethylene glycol (MIRALAX / GLYCOLAX) 17 g packet    Sig: Take 17 g by mouth daily.    Dispense:  30 each    Refill:  2   simvastatin (ZOCOR) 20 MG tablet    Sig: Take 1 tablet (20 mg total) by mouth daily.    Dispense:  30 tablet    Refill:  4   tamsulosin (FLOMAX) 0.4 MG CAPS capsule    Sig: Take 1 capsule (0.4 mg total) by mouth daily.    Dispense:  30 capsule    Refill:  3   topiramate (TOPAMAX) 100 MG tablet    Sig: Take 1 tablet (100 mg total) by mouth 2 (two) times daily.  Dispense:  60 tablet    Refill:  2   traZODone (DESYREL) 150 MG tablet    Sig: Take 1 tablet (150 mg total) by mouth at bedtime as needed for sleep.    Dispense:  30 tablet    Refill:  1   ursodiol (ACTIGALL) 250 MG tablet    Sig: Take 1 tablet (250 mg total) by mouth 2 (two) times daily.    Dispense:  60 tablet    Refill:  5   Flu vaccine and Prevnar 20 given  Follow-up: Return in about 3 months (around 08/22/2021).    Asencion Noble, MD

## 2021-05-22 ENCOUNTER — Other Ambulatory Visit (HOSPITAL_BASED_OUTPATIENT_CLINIC_OR_DEPARTMENT_OTHER): Payer: Self-pay | Admitting: Orthopaedic Surgery

## 2021-05-22 ENCOUNTER — Other Ambulatory Visit: Payer: Self-pay

## 2021-05-22 ENCOUNTER — Ambulatory Visit: Payer: Medicaid Other | Attending: Critical Care Medicine | Admitting: Critical Care Medicine

## 2021-05-22 ENCOUNTER — Encounter: Payer: Self-pay | Admitting: Critical Care Medicine

## 2021-05-22 VITALS — BP 138/86 | HR 71 | Resp 16 | Wt >= 6400 oz

## 2021-05-22 DIAGNOSIS — E1165 Type 2 diabetes mellitus with hyperglycemia: Secondary | ICD-10-CM

## 2021-05-22 DIAGNOSIS — M7730 Calcaneal spur, unspecified foot: Secondary | ICD-10-CM | POA: Insufficient documentation

## 2021-05-22 DIAGNOSIS — K219 Gastro-esophageal reflux disease without esophagitis: Secondary | ICD-10-CM

## 2021-05-22 DIAGNOSIS — Z6841 Body Mass Index (BMI) 40.0 and over, adult: Secondary | ICD-10-CM

## 2021-05-22 DIAGNOSIS — M25562 Pain in left knee: Secondary | ICD-10-CM

## 2021-05-22 DIAGNOSIS — E1142 Type 2 diabetes mellitus with diabetic polyneuropathy: Secondary | ICD-10-CM | POA: Diagnosis not present

## 2021-05-22 DIAGNOSIS — G8929 Other chronic pain: Secondary | ICD-10-CM

## 2021-05-22 DIAGNOSIS — M25561 Pain in right knee: Secondary | ICD-10-CM

## 2021-05-22 DIAGNOSIS — I10 Essential (primary) hypertension: Secondary | ICD-10-CM

## 2021-05-22 DIAGNOSIS — F1410 Cocaine abuse, uncomplicated: Secondary | ICD-10-CM

## 2021-05-22 DIAGNOSIS — K9089 Other intestinal malabsorption: Secondary | ICD-10-CM

## 2021-05-22 DIAGNOSIS — Z23 Encounter for immunization: Secondary | ICD-10-CM

## 2021-05-22 LAB — GLUCOSE, POCT (MANUAL RESULT ENTRY): POC Glucose: 135 mg/dl — AB (ref 70–99)

## 2021-05-22 MED ORDER — METFORMIN HCL 1000 MG PO TABS
1000.0000 mg | ORAL_TABLET | Freq: Two times a day (BID) | ORAL | 1 refills | Status: DC
  Filled 2021-05-22: qty 180, 90d supply, fill #0
  Filled 2021-05-30 – 2021-09-12 (×2): qty 60, 30d supply, fill #0

## 2021-05-22 MED ORDER — TRULICITY 0.75 MG/0.5ML ~~LOC~~ SOAJ
0.7500 mg | SUBCUTANEOUS | 4 refills | Status: DC
Start: 2021-05-22 — End: 2021-09-12
  Filled 2021-05-22 – 2021-06-15 (×3): qty 2, 28d supply, fill #0
  Filled 2021-07-07: qty 2, 28d supply, fill #1
  Filled 2021-09-12 (×2): qty 2, 28d supply, fill #0

## 2021-05-22 MED ORDER — TRAZODONE HCL 150 MG PO TABS
150.0000 mg | ORAL_TABLET | Freq: Every evening | ORAL | 1 refills | Status: DC | PRN
  Filled 2021-05-22 – 2021-09-12 (×3): qty 30, 30d supply, fill #0

## 2021-05-22 MED ORDER — MELOXICAM 15 MG PO TABS
15.0000 mg | ORAL_TABLET | Freq: Every day | ORAL | 1 refills | Status: DC
  Filled 2021-05-22 – 2021-06-15 (×3): qty 30, 30d supply, fill #0
  Filled 2021-07-07: qty 30, 30d supply, fill #1

## 2021-05-22 MED ORDER — OMEPRAZOLE 40 MG PO CPDR
40.0000 mg | DELAYED_RELEASE_CAPSULE | Freq: Every day | ORAL | 4 refills | Status: DC
  Filled 2021-05-22 – 2021-09-12 (×3): qty 30, 30d supply, fill #0

## 2021-05-22 MED ORDER — URSODIOL 250 MG PO TABS
250.0000 mg | ORAL_TABLET | Freq: Two times a day (BID) | ORAL | 5 refills | Status: DC
  Filled 2021-05-22 – 2021-05-30 (×2): qty 60, 30d supply, fill #0

## 2021-05-22 MED ORDER — POLYETHYLENE GLYCOL 3350 17 G PO PACK
17.0000 g | PACK | Freq: Every day | ORAL | 2 refills | Status: AC
  Filled 2021-05-22 – 2021-09-13 (×3): qty 28, 28d supply, fill #0

## 2021-05-22 MED ORDER — GLIPIZIDE 5 MG PO TABS
5.0000 mg | ORAL_TABLET | Freq: Two times a day (BID) | ORAL | 3 refills | Status: DC
  Filled 2021-05-22 – 2021-09-12 (×3): qty 60, 30d supply, fill #0

## 2021-05-22 MED ORDER — TAMSULOSIN HCL 0.4 MG PO CAPS
0.4000 mg | ORAL_CAPSULE | Freq: Every day | ORAL | 3 refills | Status: DC
  Filled 2021-05-22 – 2021-09-12 (×3): qty 30, 30d supply, fill #0

## 2021-05-22 MED ORDER — TOPIRAMATE 100 MG PO TABS
100.0000 mg | ORAL_TABLET | Freq: Two times a day (BID) | ORAL | 2 refills | Status: DC
  Filled 2021-05-22 – 2021-05-30 (×2): qty 60, 30d supply, fill #0
  Filled 2021-07-07: qty 60, 30d supply, fill #1
  Filled 2021-09-12: qty 60, 30d supply, fill #0

## 2021-05-22 MED ORDER — ACCU-CHEK SOFTCLIX LANCETS MISC
12 refills | Status: AC
  Filled 2021-05-22: qty 100, 90d supply, fill #0
  Filled 2021-05-30: qty 100, 25d supply, fill #0

## 2021-05-22 MED ORDER — ACCU-CHEK GUIDE VI STRP
ORAL_STRIP | 12 refills | Status: AC
  Filled 2021-05-22: qty 100, 90d supply, fill #0
  Filled 2021-05-30: qty 100, 25d supply, fill #0

## 2021-05-22 MED ORDER — SIMVASTATIN 20 MG PO TABS
20.0000 mg | ORAL_TABLET | Freq: Every day | ORAL | 4 refills | Status: DC
  Filled 2021-05-22 – 2021-09-12 (×3): qty 30, 30d supply, fill #0

## 2021-05-22 MED ORDER — LISINOPRIL 30 MG PO TABS
30.0000 mg | ORAL_TABLET | Freq: Every day | ORAL | 2 refills | Status: DC
  Filled 2021-05-22: qty 90, 90d supply, fill #0
  Filled 2021-05-30: qty 30, 30d supply, fill #0
  Filled 2021-07-07: qty 30, 30d supply, fill #1
  Filled 2021-09-12: qty 30, 30d supply, fill #0

## 2021-05-22 NOTE — Assessment & Plan Note (Signed)
Patient states he is currently not using

## 2021-05-22 NOTE — Assessment & Plan Note (Signed)
Bilateral chronic knee pain exacerbated by morbid obesity will refer to orthopedics

## 2021-05-22 NOTE — Assessment & Plan Note (Signed)
Blood pressure well controlled continue lisinopril

## 2021-05-22 NOTE — Assessment & Plan Note (Signed)
We will check hemoglobin A1c and follow-up labs continue Trulicity metformin and glipizide

## 2021-05-22 NOTE — Assessment & Plan Note (Signed)
Care per podiatry

## 2021-05-22 NOTE — Assessment & Plan Note (Signed)
Patient has pending appointment with bariatric clinic encouraged to keep this appointment

## 2021-05-22 NOTE — Assessment & Plan Note (Signed)
Plan for now to continue Lyrica 220 mg twice daily

## 2021-05-22 NOTE — Patient Instructions (Signed)
Refills on all medications sent to our pharmacy  Referral to orthopedic surgery was made  Flu vaccine and pneumonia vaccine was given  Labs today we will also check for all of your chemistry blood work hemoglobin A1c and vitamin levels  Return to see Dr. Delford Field 3 months  Use the meloxicam and Voltaren gel for your knee and ankle and heel pain  Continue to follow the healthy diet as we discussed

## 2021-05-23 ENCOUNTER — Ambulatory Visit (HOSPITAL_BASED_OUTPATIENT_CLINIC_OR_DEPARTMENT_OTHER): Payer: Medicaid Other | Admitting: Orthopaedic Surgery

## 2021-05-23 ENCOUNTER — Other Ambulatory Visit: Payer: Self-pay

## 2021-05-26 ENCOUNTER — Telehealth: Payer: Self-pay

## 2021-05-26 LAB — COMPREHENSIVE METABOLIC PANEL
ALT: 57 IU/L — ABNORMAL HIGH (ref 0–44)
AST: 37 IU/L (ref 0–40)
Albumin/Globulin Ratio: 1.2 (ref 1.2–2.2)
Albumin: 3.9 g/dL (ref 3.8–4.9)
Alkaline Phosphatase: 62 IU/L (ref 44–121)
BUN/Creatinine Ratio: 15 (ref 9–20)
BUN: 16 mg/dL (ref 6–24)
Bilirubin Total: 0.6 mg/dL (ref 0.0–1.2)
CO2: 22 mmol/L (ref 20–29)
Calcium: 9.3 mg/dL (ref 8.7–10.2)
Chloride: 103 mmol/L (ref 96–106)
Creatinine, Ser: 1.07 mg/dL (ref 0.76–1.27)
Globulin, Total: 3.2 g/dL (ref 1.5–4.5)
Glucose: 126 mg/dL — ABNORMAL HIGH (ref 70–99)
Potassium: 4.5 mmol/L (ref 3.5–5.2)
Sodium: 138 mmol/L (ref 134–144)
Total Protein: 7.1 g/dL (ref 6.0–8.5)
eGFR: 82 mL/min/{1.73_m2} (ref >59)

## 2021-05-26 LAB — CBC WITH DIFFERENTIAL/PLATELET
Basophils Absolute: 0.1 10*3/uL (ref 0.0–0.2)
Basos: 1 %
EOS (ABSOLUTE): 0.5 10*3/uL — ABNORMAL HIGH (ref 0.0–0.4)
Eos: 7 %
Hematocrit: 40 % (ref 37.5–51.0)
Hemoglobin: 13.5 g/dL (ref 13.0–17.7)
Immature Grans (Abs): 0 10*3/uL (ref 0.0–0.1)
Immature Granulocytes: 0 %
Lymphocytes Absolute: 1.9 10*3/uL (ref 0.7–3.1)
Lymphs: 29 %
MCH: 33 pg (ref 26.6–33.0)
MCHC: 33.8 g/dL (ref 31.5–35.7)
MCV: 98 fL — ABNORMAL HIGH (ref 79–97)
Monocytes Absolute: 0.7 10*3/uL (ref 0.1–0.9)
Monocytes: 11 %
Neutrophils Absolute: 3.3 10*3/uL (ref 1.4–7.0)
Neutrophils: 52 %
Platelets: 294 10*3/uL (ref 150–450)
RBC: 4.09 x10E6/uL — ABNORMAL LOW (ref 4.14–5.80)
RDW: 11 % — ABNORMAL LOW (ref 11.6–15.4)
WBC: 6.5 10*3/uL (ref 3.4–10.8)

## 2021-05-26 LAB — FOLATE: Folate: 18 ng/mL (ref >3.0)

## 2021-05-26 LAB — VITAMIN B1: Thiamine: 119.9 nmol/L (ref 66.5–200.0)

## 2021-05-26 LAB — VITAMIN B12: Vitamin B-12: 714 pg/mL (ref 232–1245)

## 2021-05-26 LAB — HEMOGLOBIN A1C
Est. average glucose Bld gHb Est-mCnc: 94 mg/dL
Hgb A1c MFr Bld: 4.9 % (ref 4.8–5.6)

## 2021-05-26 NOTE — Telephone Encounter (Signed)
-----   Message from Storm Frisk, MD sent at 05/26/2021  6:48 AM EST ----- Let pt know folic acid and thiamin and vit B12 levels all normal.   Liver kidney blood count nromal  His A1c is 4.6!  Best ever levels  good work on his diabetes

## 2021-05-26 NOTE — Telephone Encounter (Signed)
Pt was called and no vm was left due to mailbox being full. Information has been sent to nurse pool.  

## 2021-05-29 ENCOUNTER — Other Ambulatory Visit: Payer: Self-pay

## 2021-05-30 ENCOUNTER — Ambulatory Visit (HOSPITAL_BASED_OUTPATIENT_CLINIC_OR_DEPARTMENT_OTHER): Payer: Medicaid Other | Admitting: Orthopaedic Surgery

## 2021-05-30 ENCOUNTER — Other Ambulatory Visit: Payer: Self-pay | Admitting: Critical Care Medicine

## 2021-05-30 ENCOUNTER — Other Ambulatory Visit: Payer: Self-pay

## 2021-05-30 MED ORDER — TADALAFIL 10 MG PO TABS
10.0000 mg | ORAL_TABLET | ORAL | 2 refills | Status: DC | PRN
Start: 2021-05-30 — End: 2021-10-23
  Filled 2021-05-30 – 2021-09-12 (×5): qty 10, 20d supply, fill #0

## 2021-05-30 NOTE — Telephone Encounter (Signed)
Requested Prescriptions  Pending Prescriptions Disp Refills  . tadalafil (CIALIS) 10 MG tablet 10 tablet 2    Sig: Take 1 tablet (10 mg total) by mouth every other day as needed for erectile dysfunction.     Urology: Erectile Dysfunction Agents Passed - 05/30/2021  2:52 PM      Passed - Last BP in normal range    BP Readings from Last 1 Encounters:  05/22/21 138/86         Passed - Valid encounter within last 12 months    Recent Outpatient Visits          1 week ago Type 2 diabetes mellitus with hyperglycemia, without long-term current use of insulin [facility])   [place] [facility] Storm Frisk, MD   4 months ago Type 2 diabetes mellitus with hyperglycemia, without long-term current use of insulin [facility])   [place] [facility] Storm Frisk, MD

## 2021-05-31 ENCOUNTER — Other Ambulatory Visit: Payer: Self-pay

## 2021-06-05 LAB — HM DIABETES EYE EXAM

## 2021-06-07 ENCOUNTER — Other Ambulatory Visit: Payer: Self-pay

## 2021-06-15 ENCOUNTER — Other Ambulatory Visit: Payer: Self-pay

## 2021-06-15 ENCOUNTER — Other Ambulatory Visit (HOSPITAL_COMMUNITY): Payer: Self-pay

## 2021-06-16 ENCOUNTER — Other Ambulatory Visit: Payer: Self-pay

## 2021-06-16 ENCOUNTER — Other Ambulatory Visit (HOSPITAL_COMMUNITY): Payer: Self-pay

## 2021-06-16 ENCOUNTER — Other Ambulatory Visit: Payer: Self-pay | Admitting: Critical Care Medicine

## 2021-06-17 MED ORDER — PREGABALIN 225 MG PO CAPS
225.0000 mg | ORAL_CAPSULE | Freq: Two times a day (BID) | ORAL | 1 refills | Status: DC
  Filled 2021-06-17 – 2021-09-13 (×3): qty 60, 30d supply, fill #0
  Filled 2021-09-13: qty 60, 30d supply, fill #1

## 2021-06-18 ENCOUNTER — Encounter: Payer: Self-pay | Admitting: Critical Care Medicine

## 2021-06-18 DIAGNOSIS — I25118 Atherosclerotic heart disease of native coronary artery with other forms of angina pectoris: Secondary | ICD-10-CM

## 2021-06-18 DIAGNOSIS — R0609 Other forms of dyspnea: Secondary | ICD-10-CM

## 2021-06-18 DIAGNOSIS — M7732 Calcaneal spur, left foot: Secondary | ICD-10-CM

## 2021-06-18 DIAGNOSIS — M79622 Pain in left upper arm: Secondary | ICD-10-CM

## 2021-06-19 ENCOUNTER — Other Ambulatory Visit (HOSPITAL_BASED_OUTPATIENT_CLINIC_OR_DEPARTMENT_OTHER): Payer: Self-pay | Admitting: Orthopaedic Surgery

## 2021-06-19 ENCOUNTER — Other Ambulatory Visit (HOSPITAL_COMMUNITY): Payer: Self-pay

## 2021-06-19 ENCOUNTER — Ambulatory Visit (INDEPENDENT_AMBULATORY_CARE_PROVIDER_SITE_OTHER): Payer: Medicaid Other | Admitting: Orthopaedic Surgery

## 2021-06-19 ENCOUNTER — Ambulatory Visit (HOSPITAL_BASED_OUTPATIENT_CLINIC_OR_DEPARTMENT_OTHER)
Admission: RE | Admit: 2021-06-19 | Discharge: 2021-06-19 | Disposition: A | Discharge status: Home / Self Care | Payer: Medicaid Other | Source: Ambulatory Visit | Attending: Orthopaedic Surgery | Admitting: Orthopaedic Surgery

## 2021-06-19 ENCOUNTER — Other Ambulatory Visit: Payer: Self-pay

## 2021-06-19 DIAGNOSIS — M25562 Pain in left knee: Secondary | ICD-10-CM | POA: Diagnosis not present

## 2021-06-19 DIAGNOSIS — M25512 Pain in left shoulder: Secondary | ICD-10-CM | POA: Diagnosis not present

## 2021-06-19 DIAGNOSIS — G8929 Other chronic pain: Secondary | ICD-10-CM | POA: Insufficient documentation

## 2021-06-19 DIAGNOSIS — M25561 Pain in right knee: Secondary | ICD-10-CM | POA: Diagnosis not present

## 2021-06-19 MED ORDER — LIDOCAINE HCL 1 % IJ SOLN
4.0000 mL | INTRAMUSCULAR | Status: AC | PRN
  Administered 2021-06-19: 4 mL

## 2021-06-19 MED ORDER — TRIAMCINOLONE ACETONIDE 40 MG/ML IJ SUSP
80.0000 mg | INTRAMUSCULAR | Status: AC | PRN
Start: 2021-06-19 — End: 2021-06-19
  Administered 2021-06-19: 80 mg via INTRA_ARTICULAR

## 2021-06-19 MED ORDER — TRIAMCINOLONE ACETONIDE 40 MG/ML IJ SUSP
80.0000 mg | INTRAMUSCULAR | Status: AC | PRN
  Administered 2021-06-19: 80 mg via INTRA_ARTICULAR

## 2021-06-19 NOTE — Progress Notes (Signed)
Chief Complaint: Bilateral knee pain     History of Present Illness:    Jeremy Bauer is a 55 y.o. male presents with right greater than left knee pain as well as left anterior shoulder pain.  He states that the knees have been painful for several years although they have been progressively more painful the last several weeks.  He has tried aquatic therapy in the past which was helpful for him.  He had previously tried physical therapy.  He states that her knees are giving out and feels like they are giving way.  He experiences popping and crepitus in bilateral knees.  His anti-inflammatories that he is able to take are quite limited as he has a history of a gastric sleeve in the setting of morbid obesity.  He has lost over 120 pounds as result of this.  He states that the left shoulder pain he experiences predominantly in the anterior shoulder as well as mid biceps.  He denies any chest pain at this time.  He has never had any injections.  He does have type 2 diabetes.  He is currently unemployed.    Surgical History:   None  PMH/PSH/Family History/Social History/Meds/Allergies:    Past Medical History:  Diagnosis Date  . Diabetes mellitus without complication ([place])   . Hyperlipidemia   . Hypertension    Past Surgical History:  Procedure Laterality Date  . BARIATRIC SURGERY     Social History   Socioeconomic History  . Marital status: Single    Spouse name: Not on file  . Number of children: Not on file  . Years of education: Not on file  . Highest education level: Not on file  Occupational History  . Not on file  Tobacco Use  . Smoking status: Never  . Smokeless tobacco: Never  Substance and Sexual Activity  . Alcohol use: Not on file  . Drug use: Not on file  . Sexual activity: Not on file  Other Topics Concern  . Not on file  Social History Narrative  . Not on file   Social Determinants of Health   Financial Resource Strain:  Not on file  Food Insecurity: Not on file  Transportation Needs: Not on file  Physical Activity: Not on file  Stress: Not on file  Social Connections: Not on file   No family history on file. No Known Allergies Current Outpatient Medications  Medication Sig Dispense Refill  . Accu-Chek Softclix Lancets lancets Use as directed 100 each 12  . Albuterol Sulfate (PROAIR RESPICLICK) 466 (90 Base) MCG/ACT AEPB Inhale 2 puffs into the lungs every 6 (six) hours as needed. 1 each 4  . Blood Glucose Monitoring Suppl (ACCU-CHEK GUIDE) w/Device KIT use as directed 1 kit 0  . Calcium Carb-Cholecalciferol (CALCIUM-VITAMIN D) 600-400 MG-UNIT TABS Take 1 tablet by mouth 2 (two) times daily. (Patient not taking: Reported on 05/22/2021)    . diclofenac Sodium (VOLTAREN) 1 % GEL Apply 2 g topically 4 (four) times daily. Rub into affected area of foot 2 to 4 times daily 100 g 2  . Dulaglutide (TRULICITY) 5.99 JT/7.0VX SOPN Inject 0.75 mg into the skin once a week. 2 mL 4  . folic acid (FOLVITE) 1 MG tablet Take 1 tablet (1 mg total) by mouth daily. 60 tablet 1  .  furosemide (LASIX) 40 MG tablet Take 1 tablet (40 mg total) by mouth daily as needed. 30 tablet 3  . glipiZIDE (GLUCOTROL) 5 MG tablet Take 1 tablet (5 mg total) by mouth 2 (two) times daily. 60 tablet 3  . glucose blood (ACCU-CHEK GUIDE) test strip use as directed 100 strip 12  . lisinopril (ZESTRIL) 30 MG tablet Take 1 tablet (30 mg total) by mouth daily. 90 tablet 2  . meloxicam (MOBIC) 15 MG tablet Take 1 tablet (15 mg total) by mouth daily. 30 tablet 1  . metFORMIN (GLUCOPHAGE) 1000 MG tablet Take 1 tablet (1,000 mg total) by mouth 2 (two) times daily with a meal. 180 tablet 1  . Multiple Vitamin (MULTIVITAMIN ADULT PO) Take 1 tablet by mouth daily.    Marland Kitchen nystatin powder Apply 1 application topically 3 (three) times daily. 60 g 2  . omeprazole (PRILOSEC) 40 MG capsule Take 1 capsule (40 mg total) by mouth daily. 30 capsule 4  . polyethylene  glycol (MIRALAX / GLYCOLAX) 17 g packet Take 17 g (one packet) by mouth daily. 30 each 2  . pregabalin (LYRICA) 225 MG capsule Take 1 capsule (225 mg total) by mouth 2 (two) times daily. 60 capsule 1  . simvastatin (ZOCOR) 20 MG tablet Take 1 tablet (20 mg total) by mouth daily. 30 tablet 4  . tadalafil (CIALIS) 10 MG tablet Take 1 tablet (10 mg total) by mouth every other day as needed for erectile dysfunction. 10 tablet 2  . tamsulosin (FLOMAX) 0.4 MG CAPS capsule Take 1 capsule (0.4 mg total) by mouth daily. 30 capsule 3  . thiamine 100 MG tablet Take 1 tablet (100 mg total) by mouth daily. 60 tablet 3  . topiramate (TOPAMAX) 100 MG tablet Take 1 tablet (100 mg total) by mouth 2 (two) times daily. 60 tablet 2  . traZODone (DESYREL) 150 MG tablet Take 1 tablet (150 mg total) by mouth at bedtime as needed for sleep. 30 tablet 1  . ursodiol (ACTIGALL) 250 MG tablet Take 1 tablet (250 mg total) by mouth 2 (two) times daily. 60 tablet 5   No current facility-administered medications for this visit.   No results found.  Review of Systems:   A ROS was performed including pertinent positives and negatives as documented in the HPI.  Physical Exam :   Constitutional: NAD and appears stated age Neurological: Alert and oriented Psych: Appropriate affect and cooperative There were no vitals taken for this visit.   Comprehensive Musculoskeletal Exam:      Musculoskeletal Exam  Gait Antalgic l  Alignment Normal   Right Left  Inspection Normal Normal  Palpation    Tenderness Medial patellofemoral Medial patellofemoral  Crepitus None None  Effusion None None  Range of Motion    Extension 0 0  Flexion 135 135  Strength    Extension 5/5 5/5  Flexion 5/5 5/5  Ligament Exam     Generalized Laxity No No  Lachman Negative Negative   Pivot Shift Negative Negative  Anterior Drawer Negative Negative  Valgus at 0 Negative Negative  Valgus at 20 Negative Negative  Varus at 0 0 0  Varus at 20    0 0  Posterior Drawer at 90 0 0  Vascular/Lymphatic Exam    Edema None None  Venous Stasis Changes No No  Distal Circulation Normal Normal  Neurologic    Light Touch Sensation Intact Intact  Special Tests:      Imaging:   Xray (x-rays 4  views left knee, x-rays 4 views right knee): There is end-stage tricompartmental osteoarthritis of both knees    Procedure Note  Patient: TANNOR PYON             Date of Birth: 1966/03/03           MRN: [id number]             Visit Date: 06/19/2021  Procedures: Visit Diagnoses:  1. Chronic pain of both knees     Large Joint Inj: R knee on 06/19/2021 12:17 PM Indications: pain Details: 22 G 1.5 in needle, ultrasound-guided anterior approach  Arthrogram: No  Medications: 4 mL lidocaine 1 %; 80 mg triamcinolone acetonide 40 MG/ML Outcome: tolerated well, no immediate complications Procedure, treatment alternatives, risks and benefits explained, specific risks discussed. Consent was given by the patient. Immediately prior to procedure a time out was called to verify the correct patient, procedure, equipment, support staff and site/side marked as required. Patient was prepped and draped in the usual sterile fashion.    Large Joint Inj: L knee on 06/19/2021 12:18 PM Indications: pain Details: 22 G 1.5 in needle, ultrasound-guided anterior approach  Arthrogram: No  Medications: 4 mL lidocaine 1 %; 80 mg triamcinolone acetonide 40 MG/ML Outcome: tolerated well, no immediate complications Procedure, treatment alternatives, risks and benefits explained, specific risks discussed. Consent was given by the patient. Immediately prior to procedure a time out was called to verify the correct patient, procedure, equipment, support staff and site/side marked as required. Patient was prepped and draped in the usual sterile fashion.        I personally reviewed and interpreted the radiographs.   Assessment:   55 year old male with bilateral  severe osteoarthritis.  We discussed that I would not recommend any type of arthroplasty given his obesity as I think this is unlikely to provide him a durable outcome.  We also discussed the risk that can come with infection.  I have counseled him on weight loss which he would like to continue to try.  I have also counseled him on an anti-inflammatory diet.  I would like to perform bilateral ultrasound-guided injections of his knees today.  I have advised him on the risks of increasing his blood sugar and to check on these multiple times daily following this type of injection.  I have also offered him a left shoulder injection for biceps tendinitis but he is deferring this at today's visit.  I would like to have him participate in physical therapy for aquatic therapy  Plan :    -PT ordered for aquatic therapy -Bilateral ultrasound-guided injections of the knees today       I personally saw and evaluated the patient, and participated in the management and treatment plan.  Vanetta Mulders, MD Attending Physician, Orthopedic Surgery  This document was dictated using Dragon voice recognition software. A reasonable attempt at proof reading has been made to minimize errors.

## 2021-06-20 ENCOUNTER — Encounter (HOSPITAL_BASED_OUTPATIENT_CLINIC_OR_DEPARTMENT_OTHER): Payer: Self-pay | Admitting: Orthopaedic Surgery

## 2021-06-20 ENCOUNTER — Other Ambulatory Visit: Payer: Self-pay

## 2021-06-20 ENCOUNTER — Other Ambulatory Visit (HOSPITAL_COMMUNITY): Payer: Self-pay

## 2021-06-26 ENCOUNTER — Other Ambulatory Visit: Payer: Self-pay

## 2021-06-27 ENCOUNTER — Ambulatory Visit: Payer: Self-pay | Admitting: Podiatry

## 2021-06-27 ENCOUNTER — Other Ambulatory Visit: Payer: Self-pay

## 2021-06-28 ENCOUNTER — Other Ambulatory Visit (HOSPITAL_COMMUNITY): Payer: Self-pay

## 2021-07-06 ENCOUNTER — Ambulatory Visit: Payer: Self-pay | Admitting: Podiatry

## 2021-07-07 ENCOUNTER — Other Ambulatory Visit: Payer: Self-pay

## 2021-07-10 ENCOUNTER — Other Ambulatory Visit: Payer: Self-pay

## 2021-07-21 ENCOUNTER — Other Ambulatory Visit: Payer: Self-pay

## 2021-07-21 ENCOUNTER — Other Ambulatory Visit (HOSPITAL_COMMUNITY): Payer: Self-pay

## 2021-08-01 ENCOUNTER — Ambulatory Visit: Payer: Self-pay | Admitting: Podiatry

## 2021-08-08 ENCOUNTER — Other Ambulatory Visit: Payer: Self-pay | Admitting: Podiatry

## 2021-08-08 DIAGNOSIS — M7731 Calcaneal spur, right foot: Secondary | ICD-10-CM

## 2021-09-12 ENCOUNTER — Other Ambulatory Visit (HOSPITAL_COMMUNITY): Payer: Self-pay

## 2021-09-12 ENCOUNTER — Other Ambulatory Visit: Payer: Self-pay | Admitting: Critical Care Medicine

## 2021-09-12 ENCOUNTER — Other Ambulatory Visit: Payer: Self-pay

## 2021-09-12 NOTE — Telephone Encounter (Signed)
Medication Refill - Medication:  ?pregabalin (LYRICA) 225 MG capsule(Needs a new script) ? ?glipiZIDE (GLUCOTROL) 5 MG tablet  ? ?Dulaglutide (TRULICITY) 0.75 MG/0.5ML SOPN  ? ? ?Has the patient contacted their pharmacy? Yes.   ?Contact PCP- one needs a new script and the others nees PA* ? ?Preferred Pharmacy (with phone number or street name):  ?[facility]  ?Phone:  [phone] ?Fax:  [phone] ? ? ?Has the patient been seen for an appointment in the last year OR does the patient have an upcoming appointment? Yes.   ? ?Agent: Please be advised that RX refills may take up to 3 business days. We ask that you follow-up with your pharmacy. ?

## 2021-09-13 ENCOUNTER — Other Ambulatory Visit: Payer: Self-pay | Admitting: Critical Care Medicine

## 2021-09-13 ENCOUNTER — Other Ambulatory Visit: Payer: Self-pay

## 2021-09-13 ENCOUNTER — Encounter: Payer: Self-pay | Admitting: Critical Care Medicine

## 2021-09-13 MED ORDER — MELOXICAM 15 MG PO TABS
15.0000 mg | ORAL_TABLET | Freq: Every day | ORAL | 1 refills | Status: DC
Start: 2021-09-13 — End: 2021-10-23
  Filled 2021-09-13: qty 30, 30d supply, fill #0

## 2021-09-13 MED ORDER — GLIPIZIDE 5 MG PO TABS: 5.0000 mg | ORAL_TABLET | Freq: Two times a day (BID) | ORAL | 2 refills | Status: DC

## 2021-09-13 MED ORDER — PREGABALIN 225 MG PO CAPS
225.0000 mg | ORAL_CAPSULE | Freq: Two times a day (BID) | ORAL | 1 refills | Status: DC
  Filled 2021-09-13 – 2021-09-15 (×3): qty 60, 30d supply, fill #0

## 2021-09-13 MED ORDER — TRULICITY 0.75 MG/0.5ML ~~LOC~~ SOAJ
0.7500 mg | SUBCUTANEOUS | 2 refills | Status: DC
  Filled 2021-09-13: qty 2, 28d supply, fill #0

## 2021-09-13 NOTE — Telephone Encounter (Signed)
Requested medications are due for refill today.  yes ? ?Requested medications are on the active medications list.  yes ? ?Last refill. 06/17/2021 #60 1 refill ? ?Future visit scheduled.   no ? ?Notes to clinic.  Medication not delegated. ? ? ? ?Requested Prescriptions  ?Pending Prescriptions Disp Refills  ? pregabalin (LYRICA) 225 MG capsule 60 capsule 1  ?  Sig: Take 1 capsule (225 mg total) by mouth 2 (two) times daily.  ?  ? Not Delegated - Neurology:  Anticonvulsants - Controlled - pregabalin Failed - 09/12/2021 12:04 PM  ?  ?  Failed - This refill cannot be delegated  ?  ?  Passed - Cr in normal range and within 360 days  ?  Creatinine, Ser  ?Date Value Ref Range Status  ?05/22/2021 1.07 0.76 - 1.27 mg/dL Final  ?  ?  ?  ?  Passed - Completed PHQ-2 or PHQ-9 in the last 360 days  ?  ?  Passed - Valid encounter within last 12 months  ?  Recent Outpatient Visits   ? ?      ? 3 months ago Type 2 diabetes mellitus with hyperglycemia, without long-term current use of insulin (HCC)  ? [facility]  ? 8 months ago Type 2 diabetes mellitus with hyperglycemia, without long-term current use of insulin (HCC)  ? [facility] Storm Frisk, MD  ? ?  ?  ? ?  ?  ?  ?Signed Prescriptions Disp Refills  ? glipiZIDE (GLUCOTROL) 5 MG tablet 60 tablet 2  ?  Sig: Take 1 tablet (5 mg total) by mouth 2 (two) times daily.  ?  ? Endocrinology:  Diabetes - Sulfonylureas Passed - 09/12/2021 12:04 PM  ?  ?  Passed - HBA1C is between 0 and 7.9 and within 180 days  ?  Hgb A1c MFr Bld  ?Date Value Ref Range Status  ?05/22/2021 4.9 4.8 - 5.6 % Final  ?  Comment:  ?           Prediabetes: 5.7 - 6.4 ?         Diabetes: >6.4 ?         Glycemic control for adults with diabetes: <7.0 ?  ?  ?  ?  ?  Passed - Cr in normal range and within 360 days  ?  Creatinine, Ser  ?Date Value Ref Range Status  ?05/22/2021 1.07 0.76 - 1.27 mg/dL Final  ?  ?  ?  ?  Passed - Valid  encounter within last 6 months  ?  Recent Outpatient Visits   ? ?      ? 3 months ago Type 2 diabetes mellitus with hyperglycemia, without long-term current use of insulin (HCC)  ? [facility] Storm Frisk, [facility]  ? 8 months ago Type 2 diabetes mellitus with hyperglycemia, without long-term current use of insulin (HCC)  ? [facility] Storm Frisk, MD  ? ?  ?  ? ?  ?  ?  ? Dulaglutide (TRULICITY) 0.75 MG/0.5ML SOPN 2 mL 2  ?  Sig: Inject 0.75 mg into the skin once a week.  ?  ? Endocrinology:  Diabetes - GLP-1 Receptor Agonists Passed - 09/12/2021 12:04 PM  ?  ?  Passed - HBA1C is between 0 and 7.9 and within 180 days  ?  Hgb A1c MFr Bld  ?Date  Value Ref Range Status  ?05/22/2021 4.9 4.8 - 5.6 % Final  ?  Comment:  ?           Prediabetes: 5.7 - 6.4 ?         Diabetes: >6.4 ?         Glycemic control for adults with diabetes: <7.0 ?  ?  ?  ?  ?  Passed - Valid encounter within last 6 months  ?  Recent Outpatient Visits   ? ?      ? 3 months ago Type 2 diabetes mellitus with hyperglycemia, without long-term current use of insulin (HCC)  ? [facility] Storm Frisk, MD  ? 8 months ago Type 2 diabetes mellitus with hyperglycemia, without long-term current use of insulin (HCC)  ? [facility] Storm Frisk, MD  ? ?  ?  ? ?  ?  ?  ?  ?

## 2021-09-13 NOTE — Telephone Encounter (Signed)
Requested Prescriptions  ?Pending Prescriptions Disp Refills  ?? glipiZIDE (GLUCOTROL) 5 MG tablet 60 tablet 3  ?  Sig: Take 1 tablet (5 mg total) by mouth 2 (two) times daily.  ?  ? Endocrinology:  Diabetes - Sulfonylureas Passed - 09/12/2021 12:04 PM  ?  ?  Passed - HBA1C is between 0 and 7.9 and within 180 days  ?  Hgb A1c MFr Bld  ?Date Value Ref Range Status  ?05/22/2021 4.9 4.8 - 5.6 % Final  ?  Comment:  ?           Prediabetes: 5.7 - 6.4 ?         Diabetes: >6.4 ?         Glycemic control for adults with diabetes: <7.0 ?  ?   ?  ?  Passed - Cr in normal range and within 360 days  ?  Creatinine, Ser  ?Date Value Ref Range Status  ?05/22/2021 1.07 0.76 - 1.27 mg/dL Final  ?   ?  ?  Passed - Valid encounter within last 6 months  ?  Recent Outpatient Visits   ?      ? 3 months ago Type 2 diabetes mellitus with hyperglycemia, without long-term current use of insulin ([place])  ? [place] Elsie Stain, MD  ? 8 months ago Type 2 diabetes mellitus with hyperglycemia, without long-term current use of insulin ([place])  ? [place] Elsie Stain, MD  ?  ?  ? ?  ?  ?  ?? pregabalin (LYRICA) 225 MG capsule 60 capsule 1  ?  Sig: Take 1 capsule (225 mg total) by mouth 2 (two) times daily.  ?  ? Not Delegated - Neurology:  Anticonvulsants - Controlled - pregabalin Failed - 09/12/2021 12:04 PM  ?  ?  Failed - This refill cannot be delegated  ?  ?  Passed - Cr in normal range and within 360 days  ?  Creatinine, Ser  ?Date Value Ref Range Status  ?05/22/2021 1.07 0.76 - 1.27 mg/dL Final  ?   ?  ?  Passed - Completed PHQ-2 or PHQ-9 in the last 360 days  ?  ?  Passed - Valid encounter within last 12 months  ?  Recent Outpatient Visits   ?      ? 3 months ago Type 2 diabetes mellitus with hyperglycemia, without long-term current use of insulin ([place])  ? [place] Elsie Stain, MD  ? 8 months ago Type 2 diabetes mellitus with  hyperglycemia, without long-term current use of insulin ([place])  ? [place] Elsie Stain, MD  ?  ?  ? ?  ?  ?  ?? Dulaglutide (TRULICITY) [id number] SOPN 2 mL 4  ?  Sig: Inject 0.75 mg into the skin once a week.  ?  ? Endocrinology:  Diabetes - GLP-1 Receptor Agonists Passed - 09/12/2021 12:04 PM  ?  ?  Passed - HBA1C is between 0 and 7.9 and within 180 days  ?  Hgb A1c MFr Bld  ?Date Value Ref Range Status  ?05/22/2021 4.9 4.8 - 5.6 % Final  ?  Comment:  ?           Prediabetes: 5.7 - 6.4 ?         Diabetes: >6.4 ?         Glycemic control for adults with  diabetes: <7.0 ?  ?   ?  ?  Passed - Valid encounter within last 6 months  ?  Recent Outpatient Visits   ?      ? 3 months ago Type 2 diabetes mellitus with hyperglycemia, without long-term current use of insulin ([place])  ? [place] Elsie Stain, MD  ? 8 months ago Type 2 diabetes mellitus with hyperglycemia, without long-term current use of insulin ([place])  ? [place] Elsie Stain, MD  ?  ?  ? ?  ?  ?  ? ?

## 2021-09-14 ENCOUNTER — Other Ambulatory Visit: Payer: Self-pay

## 2021-09-15 ENCOUNTER — Other Ambulatory Visit: Payer: Self-pay

## 2021-09-19 ENCOUNTER — Other Ambulatory Visit: Payer: Self-pay

## 2021-09-26 ENCOUNTER — Other Ambulatory Visit: Payer: Self-pay

## 2021-10-21 DIAGNOSIS — L299 Pruritus, unspecified: Secondary | ICD-10-CM | POA: Insufficient documentation

## 2021-10-21 DIAGNOSIS — R0981 Nasal congestion: Secondary | ICD-10-CM | POA: Insufficient documentation

## 2021-10-22 NOTE — Progress Notes (Signed)
? ?Established Patient Office Visit ? ?Subjective:  ?Patient ID: Jeremy Bauer, male    DOB: September 17, 1965  Age: 56 y.o. MRN: [id number] ? ?CC:  ?No chief complaint on file. ? ? ? ?HPI ?05/2021 ?Jeremy Bauer presents for primary care follow-up visit ?Patient with pre-existing morbid obesity chronic peripheral neuropathy hypertension type 2 diabetes alcohol use in the past.  Prior history of gastric bypass surgery with malabsorption, hyperlipidemia. ?Also history of sleep apnea on BiPAP.  Chronic weakness both lower extremities.  Prior history of cocaine use. ? ?Patient is managed for his diabetes with metformin, glipizide, Trulicity.  Patient's blood sugar on arrival is 135.  He is taking his lisinopril for blood pressure blood pressure is good 138/86. ? ?Patient is questioning whether he benefit from vitamin supplementation.  He cannot afford oral vitamins over-the-counter at this time because of finances. ? ?Patient does have heel spurs follows with podiatry for this uses topical Voltaren gel and he will elevations support.  Patient has an appointment for his diabetic shoe assessment.  Patient did agree to receive Pneumovax Prevnar 20 and flu vaccine at this visit. ? ?Patient did get a colonoscopy at age 6 and a previous health system and was negative. ? ?10/23/2021 ?Patient returns in follow-up and is done quite well he is now down to 384 pounds from her previous weight in November 4 112.  Patient is trying to follow a healthier diet is getting some exercise in.  He is no longer using his walker.  He is interested still low empiric personal care services and home.  He now has a 2 bedroom apartment on [address]  He still complains of severe joint pain in both knees.  He would like referral back to orthopedic surgery.  Complains of some itching in the nose and crusting in the nose nasal congestion as well with the spring allergies.  Blood pressure initially was elevated but on recheck was 133/88. ?There are no  other new complaints ?A1c is down to 5.3 blood sugar 99 ?Past Medical History:  ?Diagnosis Date  ? Diabetes mellitus without complication ([place])   ? Hyperlipidemia   ? Hypertension   ? ? ?Past Surgical History:  ?Procedure Laterality Date  ? BARIATRIC SURGERY    ? ? ?History reviewed. No pertinent family history. ? ?Social History  ? ?Socioeconomic History  ? Marital status: Single  ?  Spouse name: Not on file  ? Number of children: Not on file  ? Years of education: Not on file  ? Highest education level: Not on file  ?Occupational History  ? Not on file  ?Tobacco Use  ? Smoking status: Never  ? Smokeless tobacco: Never  ?Substance and Sexual Activity  ? Alcohol use: Not on file  ? Drug use: Not on file  ? Sexual activity: Not on file  ?Other Topics Concern  ? Not on file  ?Social History Narrative  ? Not on file  ? ?Social Determinants of Health  ? ?Financial Resource Strain: Not on file  ?Food Insecurity: Not on file  ?Transportation Needs: Unmet Transportation Needs  ? Lack of Transportation (Medical): Yes  ? Lack of Transportation (Non-Medical): Yes  ?Physical Activity: Not on file  ?Stress: Not on file  ?Social Connections: Not on file  ?Intimate Partner Violence: Not on file  ? ? ?Outpatient Medications Prior to Visit  ?Medication Sig Dispense Refill  ? Accu-Chek Softclix Lancets lancets Use as directed 100 each 12  ? Blood Glucose Monitoring Suppl (  ACCU-CHEK GUIDE) w/Device KIT use as directed 1 kit 0  ? glucose blood (ACCU-CHEK GUIDE) test strip use as directed 100 strip 12  ? nystatin powder Apply 1 application topically 3 (three) times daily. 60 g 2  ? polyethylene glycol (MIRALAX / GLYCOLAX) 17 g packet Take 17 g (one packet) by mouth daily. 30 each 2  ? Albuterol Sulfate (PROAIR RESPICLICK) 016 (90 Base) MCG/ACT AEPB Inhale 2 puffs into the lungs every 6 (six) hours as needed. 1 each 4  ? diclofenac Sodium (VOLTAREN) 1 % GEL Apply 2 g topically 4 (four) times daily. Rub into affected area of foot 2 to 4  times daily 100 g 2  ? Dulaglutide (TRULICITY) 0.10 XN/2.3FT SOPN Inject 0.75 mg into the skin once a week. 2 mL 2  ? folic acid (FOLVITE) 1 MG tablet Take 1 tablet (1 mg total) by mouth daily. 60 tablet 1  ? glipiZIDE (GLUCOTROL) 5 MG tablet Take 1 tablet (5 mg total) by mouth 2 (two) times daily. 60 tablet 2  ? lisinopril (ZESTRIL) 30 MG tablet Take 1 tablet (30 mg total) by mouth daily. 90 tablet 2  ? meloxicam (MOBIC) 15 MG tablet Take 1 tablet (15 mg total) by mouth daily. 30 tablet 1  ? metFORMIN (GLUCOPHAGE) 1000 MG tablet Take 1 tablet (1,000 mg total) by mouth 2 (two) times daily with a meal. 180 tablet 1  ? omeprazole (PRILOSEC) 40 MG capsule Take 1 capsule (40 mg total) by mouth daily. 30 capsule 4  ? simvastatin (ZOCOR) 20 MG tablet Take 1 tablet (20 mg total) by mouth daily. 30 tablet 4  ? tadalafil (CIALIS) 10 MG tablet Take 1 tablet (10 mg total) by mouth every other day as needed for erectile dysfunction. 10 tablet 2  ? thiamine 100 MG tablet Take 1 tablet (100 mg total) by mouth daily. 60 tablet 3  ? topiramate (TOPAMAX) 100 MG tablet Take 1 tablet (100 mg total) by mouth 2 (two) times daily. 60 tablet 2  ? traZODone (DESYREL) 150 MG tablet Take 1 tablet (150 mg total) by mouth at bedtime as needed for sleep. 30 tablet 1  ? ursodiol (ACTIGALL) 250 MG tablet Take 1 tablet (250 mg total) by mouth 2 (two) times daily. 60 tablet 5  ? Calcium Carb-Cholecalciferol (CALCIUM-VITAMIN D) 600-400 MG-UNIT TABS Take 1 tablet by mouth 2 (two) times daily. (Patient not taking: Reported on 05/22/2021)    ? Multiple Vitamin (MULTIVITAMIN ADULT PO) Take 1 tablet by mouth daily.    ? furosemide (LASIX) 40 MG tablet Take 1 tablet (40 mg total) by mouth daily as needed. 30 tablet 3  ? glipiZIDE (GLUCOTROL) 5 MG tablet Take 1 tablet (5 mg total) by mouth 2 (two) times daily. 60 tablet 3  ? pregabalin (LYRICA) 225 MG capsule Take 1 capsule (225 mg total) by mouth 2 (two) times daily. (Patient not taking: Reported on  10/23/2021) 60 capsule 1  ? tamsulosin (FLOMAX) 0.4 MG CAPS capsule Take 1 capsule (0.4 mg total) by mouth daily. (Patient not taking: Reported on 10/23/2021) 30 capsule 3  ? ?No facility-administered medications prior to visit.  ? ? ?No Known Allergies ? ?ROS ?Review of Systems  ?Constitutional:  Negative for chills, diaphoresis and fever.  ?HENT:  Negative for congestion, hearing loss, nosebleeds, sore throat and tinnitus.   ?Eyes:  Negative for photophobia and redness.  ?Respiratory:  Negative for cough, shortness of breath, wheezing and stridor.   ?Cardiovascular:  Negative for chest pain,  palpitations and leg swelling.  ?Gastrointestinal:  Negative for abdominal pain, blood in stool, constipation, diarrhea, nausea and vomiting.  ?Endocrine: Negative for polydipsia.  ?Genitourinary:  Negative for dysuria, flank pain, frequency, hematuria and urgency.  ?Musculoskeletal:  Negative for back pain, gait problem, myalgias and neck pain.  ?     Foot numbness  ?Skin: Negative.  Negative for rash.  ?Allergic/Immunologic: Negative for environmental allergies.  ?Neurological:  Negative for dizziness, tremors, seizures, weakness and headaches.  ?Hematological:  Does not bruise/bleed easily.  ?Psychiatric/Behavioral:  Negative for sleep disturbance and suicidal ideas. The patient is not nervous/anxious.   ? ?  ?Objective:  ?  ?Physical Exam ?Vitals reviewed.  ?Constitutional:   ?   Appearance: Normal appearance. He is well-developed. He is obese. He is not diaphoretic.  ?   Comments: Significant weight loss is observable  ?HENT:  ?   Head: Normocephalic and atraumatic.  ?   Nose: No nasal deformity, septal deviation, mucosal edema or rhinorrhea.  ?   Right Sinus: No maxillary sinus tenderness or frontal sinus tenderness.  ?   Left Sinus: No maxillary sinus tenderness or frontal sinus tenderness.  ?   Mouth/Throat:  ?   Mouth: Mucous membranes are moist.  ?   Pharynx: Oropharynx is clear. No oropharyngeal exudate.  ?    Comments: Has dentures in the upper teeth area partial dentures lower ?Eyes:  ?   General: No scleral icterus. ?   Conjunctiva/sclera: Conjunctivae normal.  ?   Pupils: Pupils are equal, round, and reactive to light.

## 2021-10-23 ENCOUNTER — Other Ambulatory Visit: Payer: Self-pay | Admitting: Pharmacist

## 2021-10-23 ENCOUNTER — Other Ambulatory Visit: Payer: Self-pay

## 2021-10-23 ENCOUNTER — Telehealth: Payer: Self-pay

## 2021-10-23 ENCOUNTER — Encounter: Payer: Self-pay | Admitting: Critical Care Medicine

## 2021-10-23 ENCOUNTER — Telehealth: Payer: Self-pay | Admitting: Critical Care Medicine

## 2021-10-23 ENCOUNTER — Ambulatory Visit: Payer: Medicaid Other | Attending: Critical Care Medicine | Admitting: Critical Care Medicine

## 2021-10-23 VITALS — BP 133/88 | HR 97 | Wt 384.4 lb

## 2021-10-23 DIAGNOSIS — E785 Hyperlipidemia, unspecified: Secondary | ICD-10-CM | POA: Insufficient documentation

## 2021-10-23 DIAGNOSIS — G4733 Obstructive sleep apnea (adult) (pediatric): Secondary | ICD-10-CM

## 2021-10-23 DIAGNOSIS — K219 Gastro-esophageal reflux disease without esophagitis: Secondary | ICD-10-CM | POA: Diagnosis not present

## 2021-10-23 DIAGNOSIS — M7730 Calcaneal spur, unspecified foot: Secondary | ICD-10-CM | POA: Diagnosis not present

## 2021-10-23 DIAGNOSIS — F1010 Alcohol abuse, uncomplicated: Secondary | ICD-10-CM

## 2021-10-23 DIAGNOSIS — M170 Bilateral primary osteoarthritis of knee: Secondary | ICD-10-CM | POA: Insufficient documentation

## 2021-10-23 DIAGNOSIS — Z79899 Other long term (current) drug therapy: Secondary | ICD-10-CM | POA: Diagnosis not present

## 2021-10-23 DIAGNOSIS — Z6841 Body Mass Index (BMI) 40.0 and over, adult: Secondary | ICD-10-CM

## 2021-10-23 DIAGNOSIS — Z9884 Bariatric surgery status: Secondary | ICD-10-CM | POA: Diagnosis not present

## 2021-10-23 DIAGNOSIS — Z7984 Long term (current) use of oral hypoglycemic drugs: Secondary | ICD-10-CM | POA: Diagnosis not present

## 2021-10-23 DIAGNOSIS — E611 Iron deficiency: Secondary | ICD-10-CM | POA: Insufficient documentation

## 2021-10-23 DIAGNOSIS — E1142 Type 2 diabetes mellitus with diabetic polyneuropathy: Secondary | ICD-10-CM | POA: Diagnosis not present

## 2021-10-23 DIAGNOSIS — N401 Enlarged prostate with lower urinary tract symptoms: Secondary | ICD-10-CM

## 2021-10-23 DIAGNOSIS — M25562 Pain in left knee: Secondary | ICD-10-CM

## 2021-10-23 DIAGNOSIS — Z23 Encounter for immunization: Secondary | ICD-10-CM | POA: Diagnosis not present

## 2021-10-23 DIAGNOSIS — I10 Essential (primary) hypertension: Secondary | ICD-10-CM | POA: Insufficient documentation

## 2021-10-23 DIAGNOSIS — G8929 Other chronic pain: Secondary | ICD-10-CM

## 2021-10-23 DIAGNOSIS — E1165 Type 2 diabetes mellitus with hyperglycemia: Secondary | ICD-10-CM | POA: Diagnosis not present

## 2021-10-23 DIAGNOSIS — M25561 Pain in right knee: Secondary | ICD-10-CM

## 2021-10-23 DIAGNOSIS — R3911 Hesitancy of micturition: Secondary | ICD-10-CM

## 2021-10-23 DIAGNOSIS — F1410 Cocaine abuse, uncomplicated: Secondary | ICD-10-CM

## 2021-10-23 LAB — POCT GLYCOSYLATED HEMOGLOBIN (HGB A1C): HbA1c, POC (controlled diabetic range): 5.3 % (ref 0.0–7.0)

## 2021-10-23 LAB — GLUCOSE, POCT (MANUAL RESULT ENTRY): POC Glucose: 99 mg/dl (ref 70–99)

## 2021-10-23 MED ORDER — METFORMIN HCL 1000 MG PO TABS
1000.0000 mg | ORAL_TABLET | Freq: Two times a day (BID) | ORAL | 1 refills | Status: AC
  Filled 2021-10-23: qty 180, 90d supply, fill #0

## 2021-10-23 MED ORDER — FOLIC ACID 1 MG PO TABS
1.0000 mg | ORAL_TABLET | Freq: Every day | ORAL | 1 refills | Status: AC
  Filled 2021-10-23: qty 60, 60d supply, fill #0

## 2021-10-23 MED ORDER — SIMVASTATIN 20 MG PO TABS
20.0000 mg | ORAL_TABLET | Freq: Every day | ORAL | 4 refills | Status: AC
Start: 2021-10-23 — End: ?
  Filled 2021-10-23: qty 30, 30d supply, fill #0

## 2021-10-23 MED ORDER — TRULICITY 0.75 MG/0.5ML ~~LOC~~ SOAJ
0.7500 mg | SUBCUTANEOUS | 2 refills | Status: AC
  Filled 2021-10-23: qty 2, 28d supply, fill #0

## 2021-10-23 MED ORDER — TAMSULOSIN HCL 0.4 MG PO CAPS
0.4000 mg | ORAL_CAPSULE | Freq: Every day | ORAL | 3 refills | Status: AC
  Filled 2021-10-23: qty 30, 30d supply, fill #0

## 2021-10-23 MED ORDER — PREGABALIN 225 MG PO CAPS
225.0000 mg | ORAL_CAPSULE | Freq: Two times a day (BID) | ORAL | 1 refills | Status: AC
  Filled 2021-10-23 – 2021-10-24 (×3): qty 60, 30d supply, fill #0

## 2021-10-23 MED ORDER — TRAZODONE HCL 150 MG PO TABS
150.0000 mg | ORAL_TABLET | Freq: Every evening | ORAL | 1 refills | Status: AC | PRN
  Filled 2021-10-23: qty 30, 30d supply, fill #0

## 2021-10-23 MED ORDER — THIAMINE HCL 100 MG PO TABS
100.0000 mg | ORAL_TABLET | Freq: Every day | ORAL | 3 refills | Status: AC
  Filled 2021-10-23: qty 60, 60d supply, fill #0

## 2021-10-23 MED ORDER — MELOXICAM 15 MG PO TABS
15.0000 mg | ORAL_TABLET | Freq: Every day | ORAL | 1 refills | Status: AC
  Filled 2021-10-23: qty 30, 30d supply, fill #0

## 2021-10-23 MED ORDER — GLIPIZIDE 5 MG PO TABS
5.0000 mg | ORAL_TABLET | Freq: Two times a day (BID) | ORAL | 2 refills | Status: AC
Start: 2021-10-23 — End: 2022-10-23
  Filled 2021-10-23: qty 60, 30d supply, fill #0

## 2021-10-23 MED ORDER — URSODIOL 250 MG PO TABS
250.0000 mg | ORAL_TABLET | Freq: Two times a day (BID) | ORAL | 5 refills | Status: AC
Start: 2021-10-23 — End: ?
  Filled 2021-10-23: qty 60, 30d supply, fill #0

## 2021-10-23 MED ORDER — AZELASTINE HCL 0.1 % NA SOLN
2.0000 | Freq: Two times a day (BID) | NASAL | 12 refills | Status: AC
  Filled 2021-10-23: qty 30, 50d supply, fill #0

## 2021-10-23 MED ORDER — OMEPRAZOLE 40 MG PO CPDR
40.0000 mg | DELAYED_RELEASE_CAPSULE | Freq: Every day | ORAL | 4 refills | Status: AC
Start: 2021-10-23 — End: ?
  Filled 2021-10-23: qty 30, 30d supply, fill #0

## 2021-10-23 MED ORDER — PROAIR RESPICLICK 108 (90 BASE) MCG/ACT IN AEPB
2.0000 | INHALATION_SPRAY | Freq: Four times a day (QID) | RESPIRATORY_TRACT | 4 refills | Status: DC | PRN
  Filled 2021-10-23: qty 1, 25d supply, fill #0

## 2021-10-23 MED ORDER — ALBUTEROL SULFATE HFA 108 (90 BASE) MCG/ACT IN AERS
2.0000 | INHALATION_SPRAY | Freq: Four times a day (QID) | RESPIRATORY_TRACT | 3 refills | Status: AC | PRN
Start: 2021-10-23 — End: ?
  Filled 2021-10-23: qty 8.5, 25d supply, fill #0

## 2021-10-23 MED ORDER — DICLOFENAC SODIUM 1 % EX GEL
2.0000 g | Freq: Four times a day (QID) | CUTANEOUS | 2 refills | Status: AC
  Filled 2021-10-23: qty 100, 30d supply, fill #0

## 2021-10-23 MED ORDER — LISINOPRIL 30 MG PO TABS
30.0000 mg | ORAL_TABLET | Freq: Every day | ORAL | 2 refills | Status: AC
Start: 2021-10-23 — End: ?
  Filled 2021-10-23: qty 90, 90d supply, fill #0

## 2021-10-23 MED ORDER — TOPIRAMATE 100 MG PO TABS
100.0000 mg | ORAL_TABLET | Freq: Two times a day (BID) | ORAL | 2 refills | Status: AC
  Filled 2021-10-23: qty 60, 30d supply, fill #0

## 2021-10-23 MED ORDER — TADALAFIL 10 MG PO TABS
10.0000 mg | ORAL_TABLET | ORAL | 2 refills | Status: AC | PRN
Start: 2021-10-23 — End: ?
  Filled 2021-10-23: qty 10, 30d supply, fill #0

## 2021-10-23 NOTE — Assessment & Plan Note (Addendum)
No changes made in current diabetic program ?A1c is at goal ?

## 2021-10-23 NOTE — Assessment & Plan Note (Signed)
Needs screening for iron deficiency ?

## 2021-10-23 NOTE — Patient Instructions (Signed)
Referral to orthopedic surgery was made ? ?Labs today include metabolic panel iron studies ? ?Refills on all your medications sent to our pharmacy downstairs no change in dosings ? ?I will inquire about getting a personal care assistant for you in your home ? ?Begin Astelin 2 sprays twice daily each nostril for nasal congestion and itching ? ?Please inquire at the [facility] resuming your water aerobics ? ?Congratulations on the remarkable weight loss ? ?Return to see Dr. Delford Field 3 months ?

## 2021-10-23 NOTE — Assessment & Plan Note (Signed)
Needs a refill on Flomax ?

## 2021-10-23 NOTE — Assessment & Plan Note (Signed)
Blood pressure well controlled at this time no changes made refills sent to pharmacy ?

## 2021-10-23 NOTE — Telephone Encounter (Signed)
Pt is calling to ask can he receive transportation for his appt to return back home only today. Pt was advised that a 3 day notification would be needed. Pt states that he did not know that. He has a ride to the office. But would need a ride back home. Pt also states that he really needs his appt with Dr. Delford Field today. ?Please advise Cb- [phone] ?

## 2021-10-23 NOTE — Assessment & Plan Note (Signed)
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

## 2021-10-23 NOTE — Assessment & Plan Note (Signed)
Improved with diabetic treatments ? ?Continue Lyrica refills given ?

## 2021-10-23 NOTE — Assessment & Plan Note (Signed)
Referral back to orthopedics we made ?

## 2021-10-23 NOTE — Telephone Encounter (Addendum)
Patient in need of transportation home from clinic appointment today. Provided him with [employer] for [facility] which he will give to the taxi driver.   The ride was scheduled with Velma - estimate $23.   ? ?The patient was provided with the phone number for [employer] to schedule rides to upcoming appointments  ?

## 2021-10-23 NOTE — Telephone Encounter (Signed)
Pt called back in about the status if he could get transportation from his appt this afternoon at 1:30. Please advise.  ?

## 2021-10-23 NOTE — Assessment & Plan Note (Signed)
Tolerating BiPAP well no changes made ?

## 2021-10-23 NOTE — Telephone Encounter (Signed)
Patient encouraged to arrive for apt.  ?He states he was in the lobby now, going to pharmacy. Will be at appt soon. (1330) ?

## 2021-10-23 NOTE — Assessment & Plan Note (Signed)
Patient states he is not using alcohol currently ?

## 2021-10-24 ENCOUNTER — Other Ambulatory Visit: Payer: Self-pay

## 2021-10-24 LAB — BASIC METABOLIC PANEL
BUN/Creatinine Ratio: 16 (ref 9–20)
BUN: 17 mg/dL (ref 6–24)
CO2: 20 mmol/L (ref 20–29)
Calcium: 9.6 mg/dL (ref 8.7–10.2)
Chloride: 107 mmol/L — ABNORMAL HIGH (ref 96–106)
Creatinine, Ser: 1.08 mg/dL (ref 0.76–1.27)
Glucose: 98 mg/dL (ref 70–99)
Potassium: 4.3 mmol/L (ref 3.5–5.2)
Sodium: 140 mmol/L (ref 134–144)
eGFR: 81 mL/min/{1.73_m2} (ref >59)

## 2021-10-24 LAB — IRON,TIBC AND FERRITIN PANEL
Ferritin: 343 ng/mL (ref 30–400)
Iron Saturation: 41 % (ref 15–55)
Iron: 132 ug/dL (ref 38–169)
Total Iron Binding Capacity: 319 ug/dL (ref 250–450)
UIBC: 187 ug/dL (ref 111–343)

## 2021-10-25 ENCOUNTER — Telehealth: Payer: Self-pay

## 2021-10-25 ENCOUNTER — Other Ambulatory Visit: Payer: Self-pay

## 2021-10-25 ENCOUNTER — Encounter: Payer: Self-pay | Admitting: Critical Care Medicine

## 2021-10-25 NOTE — Telephone Encounter (Signed)
Tresa Endo can you help me out here?  Pt is saying his insurance wont cover lyrica?   He has transportation barriers, if it is covered can you all mail it to his home on [address], if no longer covered I will order gabapentin ?

## 2021-10-25 NOTE — Telephone Encounter (Signed)
-----   Message from Elsie Stain, MD sent at 10/24/2021  5:52 AM EDT ----- ?Let pt know kidney normal, no iron deficiency, iron is normal,  ?

## 2021-10-25 NOTE — Telephone Encounter (Signed)
Pt was called and vm was left, Information has been sent to nurse pool.   

## 2021-10-26 ENCOUNTER — Encounter: Payer: Self-pay | Admitting: Critical Care Medicine

## 2021-10-30 ENCOUNTER — Telehealth: Payer: Self-pay | Admitting: Critical Care Medicine

## 2021-10-30 ENCOUNTER — Telehealth: Payer: Self-pay

## 2021-10-30 NOTE — Telephone Encounter (Signed)
Fyi.

## 2021-10-30 NOTE — Telephone Encounter (Signed)
Called and left vm to get pt a 3 month f/u ?

## 2021-10-30 NOTE — Telephone Encounter (Signed)
I want to see what medical examiner does first   he was doing so well   this is a shock ?

## 2021-10-30 NOTE — Telephone Encounter (Signed)
Copied from [place]. Topic: General - Other ?>> Oct 30, 2021  2:38 PM Pawlus, Brayton Layman A wrote: ?Reason for CRM: [employer] called in stating this pt was found deceased yesterday 10/31/22 and the case is currently a Medical Examiner case, caller wanted to know if Dr Joya Gaskins would be singing the death certificate. Pt was found decased after picking up multiple medications from [place], [place]. ?

## 2021-10-31 NOTE — Telephone Encounter (Signed)
noted 

## 2021-11-06 DEATH — deceased

## 2021-11-08 ENCOUNTER — Ambulatory Visit (HOSPITAL_BASED_OUTPATIENT_CLINIC_OR_DEPARTMENT_OTHER): Payer: Medicaid Other | Admitting: Orthopaedic Surgery

## 2022-06-26 ENCOUNTER — Other Ambulatory Visit: Payer: Self-pay

## 2023-04-06 IMAGING — DX DG KNEE COMPLETE 4+V*L*
4 series · 4 of 4 positions shown · non-contrast
Comparison: None.

CLINICAL DATA: Chronic knee pain.  No known injury.

EXAM:
LEFT KNEE - COMPLETE 4+ VIEW

[knee ap]
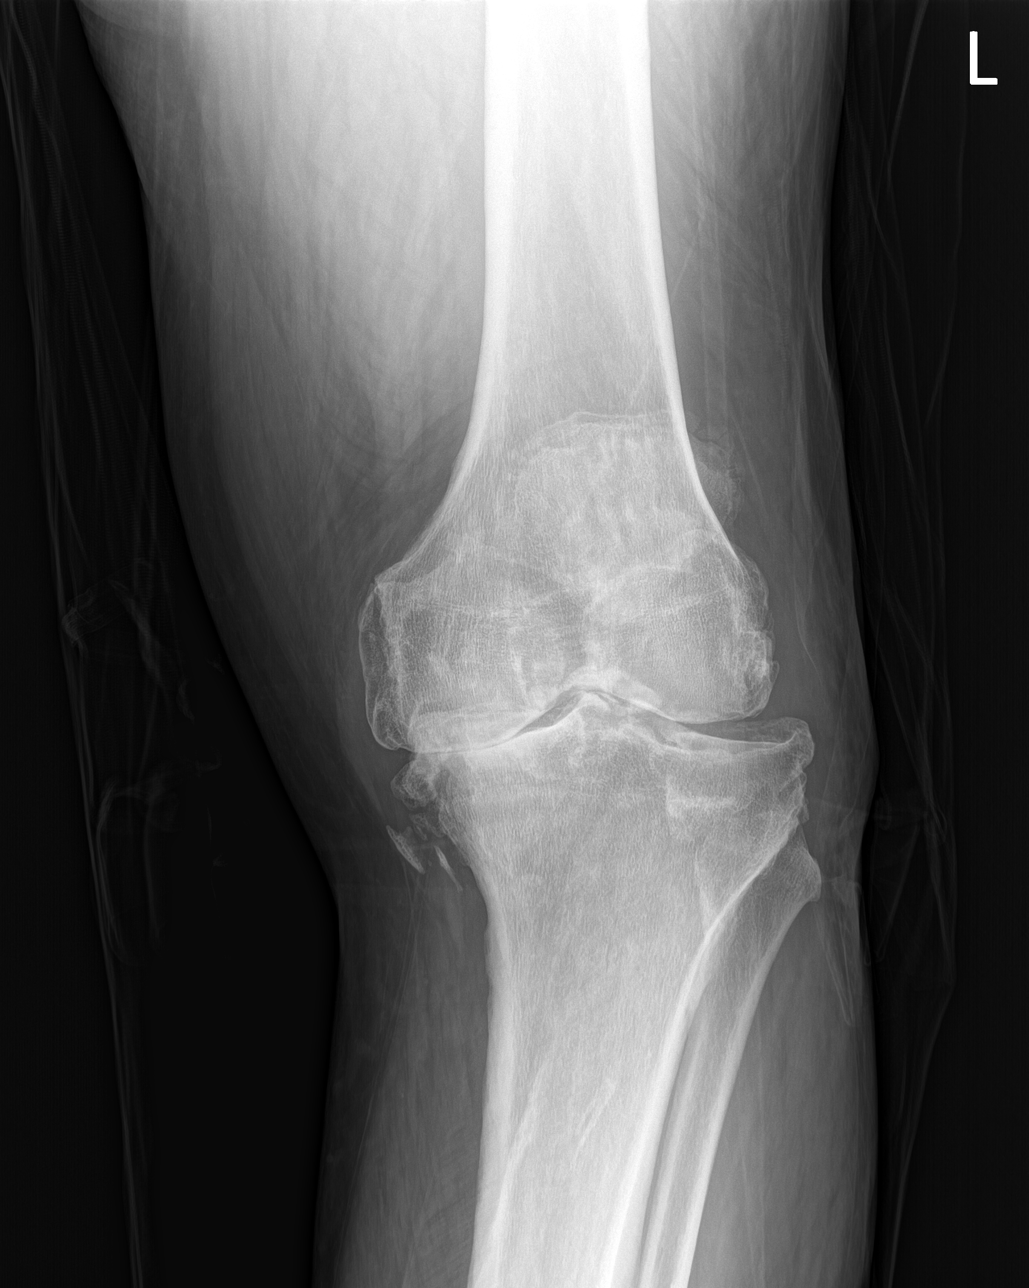

[knee lat]
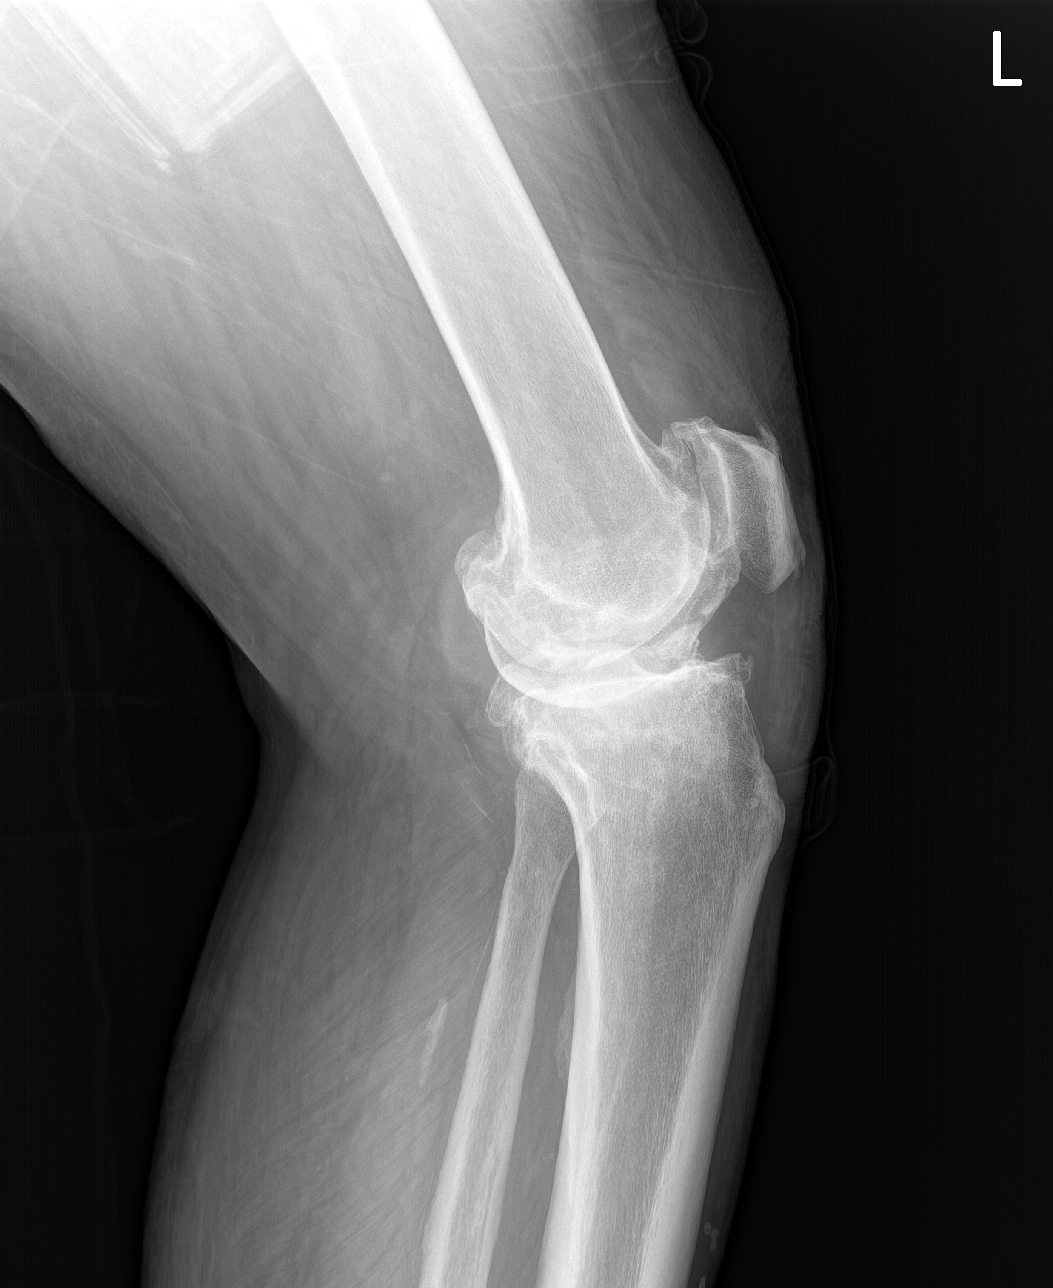

[patella skyline]
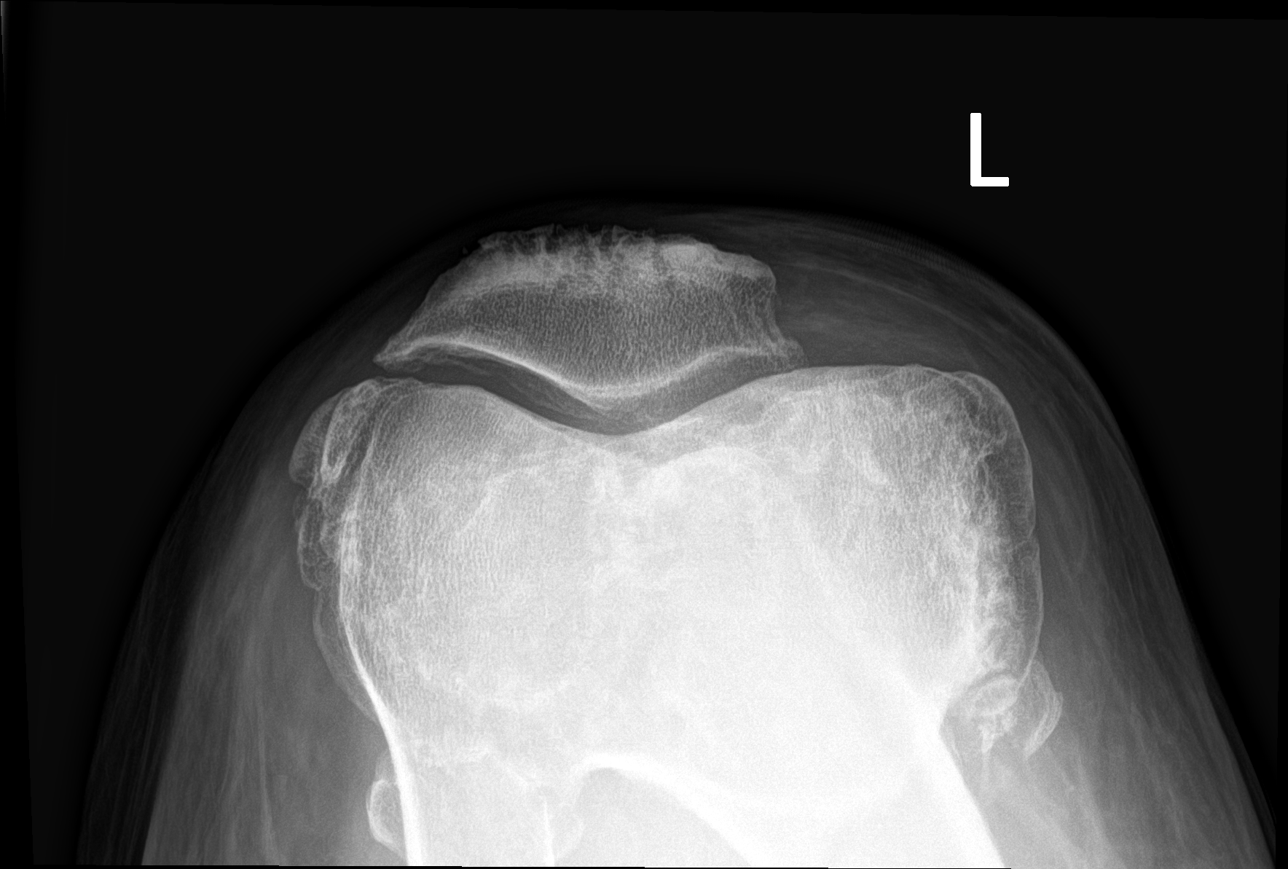

[knee tunnel]
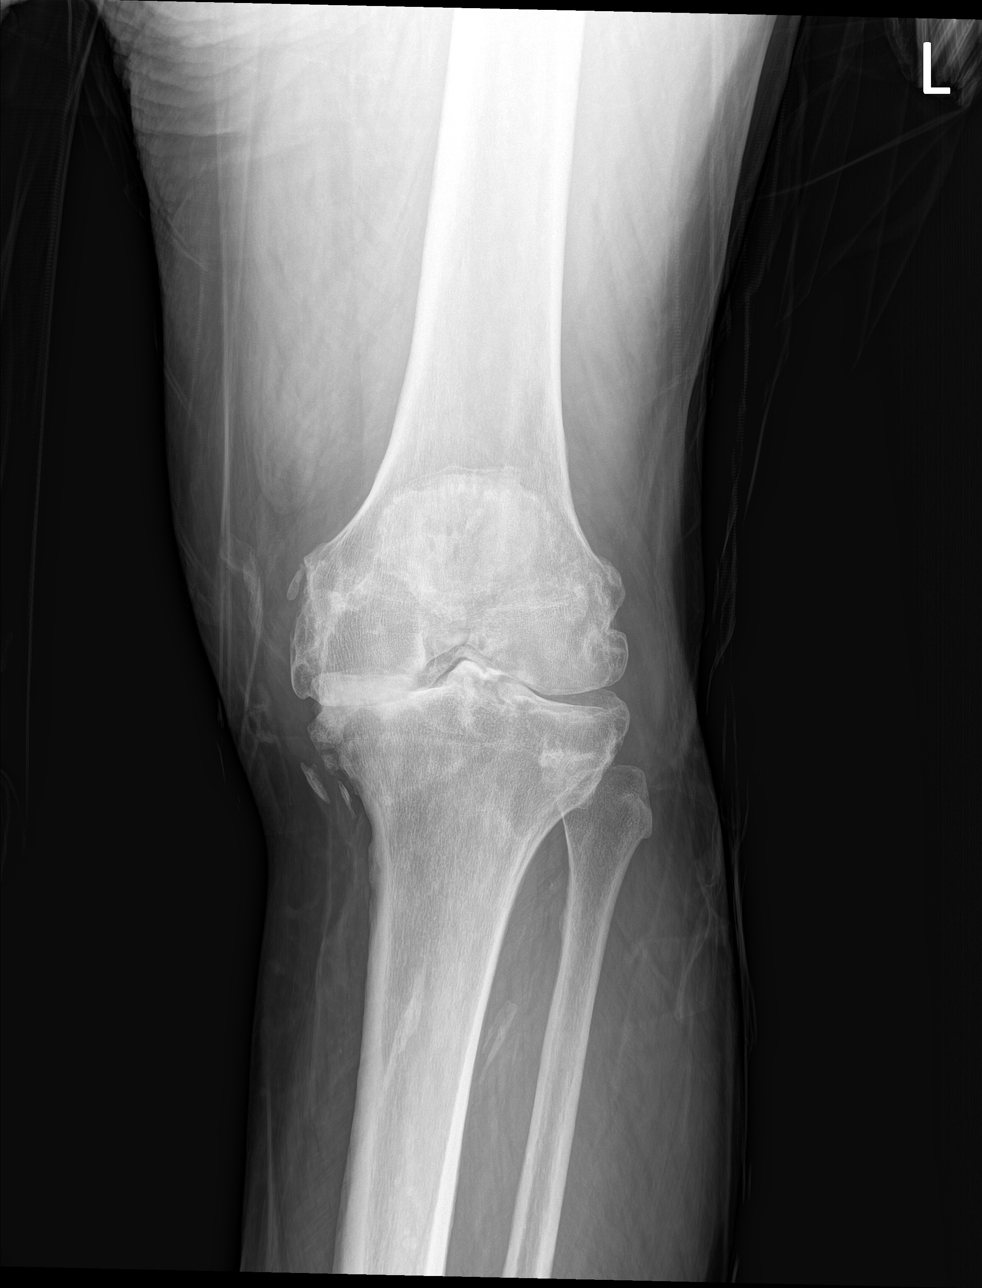

[4 of 4 positions shown; findings below may reference images not displayed]

FINDINGS: Mild varus angulation. Lateral translation of the tibia with respect
to the femur. Advanced medial tibiofemoral joint space narrowing
with near complete joint space loss. Large tricompartmental
peripheral spurs, with probable fragmented spurring of the medial
tibiofemoral compartment. Curvilinear soft tissue calcifications
adjacent to the medial tibia as well as femoral metaphysis favored
to be enthesopathic or sequela of prior MCL injury. No acute
fracture, erosion, or bone destruction. Trace knee joint effusion.
Small quadriceps and patellar tendon enthesophytes.
IMPRESSION: 1. Advanced tricompartmental osteoarthritis with mild varus
angulation and trace joint effusion. Near complete joint space loss
in the medial tibiofemoral compartment
2. Curvilinear soft tissue calcifications adjacent to the medial
tibia and femoral metaphysis favored to be enthesopathic or sequela
of prior MCL injury.
